# Patient Record
Sex: Female | Born: 1935 | Race: White | Hispanic: No | Smoking: Never smoker
Health system: Southern US, Community
[De-identification: ages and names within clinical notes are randomized; demographics above are authoritative.]

## PROBLEM LIST (undated history)

## (undated) DIAGNOSIS — G20 Parkinson's disease: Secondary | ICD-10-CM

## (undated) DIAGNOSIS — M1990 Unspecified osteoarthritis, unspecified site: Secondary | ICD-10-CM

## (undated) DIAGNOSIS — G20A1 Parkinson's disease without dyskinesia, without mention of fluctuations: Secondary | ICD-10-CM

## (undated) DIAGNOSIS — I10 Essential (primary) hypertension: Secondary | ICD-10-CM

## (undated) HISTORY — PX: ABDOMINAL HYSTERECTOMY: SHX81

## (undated) HISTORY — PX: BREAST SURGERY: SHX581

## (undated) HISTORY — PX: FRACTURE SURGERY: SHX138

## (undated) HISTORY — PX: APPENDECTOMY: SHX54

---

## 1997-07-17 ENCOUNTER — Encounter: Admission: RE | Admit: 1997-07-17 | Discharge: 1997-10-15 | Payer: Self-pay | Admitting: Radiation Oncology

## 2001-05-02 ENCOUNTER — Encounter (HOSPITAL_COMMUNITY): Admission: RE | Admit: 2001-05-02 | Discharge: 2001-06-01 | Payer: Self-pay | Admitting: Oncology

## 2001-05-02 ENCOUNTER — Encounter: Admission: RE | Admit: 2001-05-02 | Discharge: 2001-05-02 | Payer: Self-pay | Admitting: Oncology

## 2001-05-29 ENCOUNTER — Ambulatory Visit (HOSPITAL_COMMUNITY): Admission: RE | Admit: 2001-05-29 | Discharge: 2001-05-29 | Payer: Self-pay | Admitting: Pulmonary Disease

## 2002-05-01 ENCOUNTER — Encounter: Admission: RE | Admit: 2002-05-01 | Discharge: 2002-05-01 | Payer: Self-pay | Admitting: Oncology

## 2002-05-01 ENCOUNTER — Encounter (HOSPITAL_COMMUNITY): Admission: RE | Admit: 2002-05-01 | Discharge: 2002-05-31 | Payer: Self-pay | Admitting: Oncology

## 2002-06-07 ENCOUNTER — Encounter (HOSPITAL_COMMUNITY): Admission: RE | Admit: 2002-06-07 | Discharge: 2002-07-07 | Payer: Self-pay | Admitting: Oncology

## 2002-06-07 ENCOUNTER — Encounter (HOSPITAL_COMMUNITY): Payer: Self-pay | Admitting: Oncology

## 2002-06-07 ENCOUNTER — Encounter: Admission: RE | Admit: 2002-06-07 | Discharge: 2002-06-07 | Payer: Self-pay | Admitting: Oncology

## 2003-03-29 ENCOUNTER — Emergency Department (HOSPITAL_COMMUNITY): Admission: EM | Admit: 2003-03-29 | Discharge: 2003-03-30 | Payer: Self-pay | Admitting: *Deleted

## 2003-06-03 ENCOUNTER — Observation Stay (HOSPITAL_COMMUNITY): Admission: EM | Admit: 2003-06-03 | Discharge: 2003-06-04 | Payer: Self-pay | Admitting: Emergency Medicine

## 2003-07-09 ENCOUNTER — Ambulatory Visit (HOSPITAL_COMMUNITY): Admission: RE | Admit: 2003-07-09 | Discharge: 2003-07-09 | Payer: Self-pay | Admitting: Pulmonary Disease

## 2003-07-15 ENCOUNTER — Encounter: Admission: RE | Admit: 2003-07-15 | Discharge: 2003-07-15 | Payer: Self-pay | Admitting: Oncology

## 2003-07-15 ENCOUNTER — Encounter (HOSPITAL_COMMUNITY): Admission: RE | Admit: 2003-07-15 | Discharge: 2003-08-14 | Payer: Self-pay | Admitting: Oncology

## 2004-07-12 ENCOUNTER — Encounter (HOSPITAL_COMMUNITY): Admission: RE | Admit: 2004-07-12 | Discharge: 2004-08-11 | Payer: Self-pay | Admitting: Oncology

## 2004-07-12 ENCOUNTER — Ambulatory Visit (HOSPITAL_COMMUNITY): Payer: Self-pay | Admitting: Oncology

## 2004-07-12 ENCOUNTER — Encounter: Admission: RE | Admit: 2004-07-12 | Discharge: 2004-07-12 | Payer: Self-pay | Admitting: Oncology

## 2004-07-21 ENCOUNTER — Ambulatory Visit (HOSPITAL_COMMUNITY): Admission: RE | Admit: 2004-07-21 | Discharge: 2004-07-21 | Payer: Self-pay | Admitting: Pulmonary Disease

## 2005-07-12 ENCOUNTER — Ambulatory Visit (HOSPITAL_COMMUNITY): Payer: Self-pay | Admitting: Oncology

## 2005-07-12 ENCOUNTER — Encounter: Admission: RE | Admit: 2005-07-12 | Discharge: 2005-07-12 | Payer: Self-pay | Admitting: Oncology

## 2005-07-12 ENCOUNTER — Encounter (HOSPITAL_COMMUNITY): Admission: RE | Admit: 2005-07-12 | Discharge: 2005-08-11 | Payer: Self-pay | Admitting: Oncology

## 2005-07-27 ENCOUNTER — Ambulatory Visit (HOSPITAL_COMMUNITY): Admission: RE | Admit: 2005-07-27 | Discharge: 2005-07-27 | Payer: Self-pay | Admitting: Pulmonary Disease

## 2005-08-23 ENCOUNTER — Ambulatory Visit (HOSPITAL_COMMUNITY): Admission: RE | Admit: 2005-08-23 | Discharge: 2005-08-23 | Payer: Self-pay | Admitting: Pulmonary Disease

## 2006-07-12 ENCOUNTER — Ambulatory Visit (HOSPITAL_COMMUNITY): Payer: Self-pay | Admitting: Oncology

## 2006-07-31 ENCOUNTER — Ambulatory Visit (HOSPITAL_COMMUNITY): Admission: RE | Admit: 2006-07-31 | Discharge: 2006-07-31 | Payer: Self-pay | Admitting: Pulmonary Disease

## 2007-07-11 ENCOUNTER — Ambulatory Visit (HOSPITAL_COMMUNITY): Payer: Self-pay | Admitting: Oncology

## 2007-08-02 ENCOUNTER — Ambulatory Visit (HOSPITAL_COMMUNITY): Admission: RE | Admit: 2007-08-02 | Discharge: 2007-08-02 | Payer: Self-pay | Admitting: Pulmonary Disease

## 2008-08-04 ENCOUNTER — Ambulatory Visit (HOSPITAL_COMMUNITY): Admission: RE | Admit: 2008-08-04 | Discharge: 2008-08-04 | Payer: Self-pay | Admitting: Pulmonary Disease

## 2009-09-01 ENCOUNTER — Ambulatory Visit (HOSPITAL_COMMUNITY): Admission: RE | Admit: 2009-09-01 | Discharge: 2009-09-01 | Payer: Self-pay | Admitting: Pulmonary Disease
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
## 2010-09-03 NOTE — Op Note (Signed)
[name], [name]                 ACCOUNT NO.:  [id number]   MEDICAL RECORD NO.:  [id number]          PATIENT TYPE:  OUT   LOCATION:  RAD                           FACILITY:  [facility]   PHYSICIAN:  Edgardo Roys, MS,CC-SLPDATE OF BIRTH:  01-02-1917   DATE OF PROCEDURE:  08/23/2005  DATE OF DISCHARGE:                                 OPERATIVE REPORT   PROCEDURE:  Modified barium swallow study.   REFERRING PHYSICIAN:  Edward L. Juanetta Gosling, M.D.   The patient is an 77 year old female who lives at home by herself.  Medical  history is specific for:  HTN and breast cancer (approximately eight years  ago).  Patient referred for a modified barium swallow study due to reporting  feeling a tingling sensation in throat and having to cough as a result  when she eats solid foods at times.  The patient also reports having  difficulty with large pills at times.   The patient is able to follow directions to complete swallowing evaluation.   Dentition:  Dentures, both top and bottom, per patient report.   Secretions:  Normal.   Postural control:  Adequate for testing.   Current diet:  Mechanical soft and liquids thin.   This videofluoroscopic swallow evaluation was conducted in collaboration  with radiologist Dr. Tyron Russell.  The patient was presented with several  consistencies, which included thin liquids, nectar liquids, honey liquids,  puree, cracker, and pill.  The patient presents with mild oropharyngeal  dysphagia characterized by premature loss of bolus over the base of tongue  to the pyriform sinus with thin liquids (mild), nectar liquids with a straw,  and honey liquids with a straw.  Patient had mild delayed swallowing  initiation with all consistencies.  The patient had mild residue to the  vallecula with thin liquids, nectar liquids with and without a straw, as  well as mild residue to the pyriform sinus with thin liquids, nectar liquids  with a straw.  Patient also had insignificant to  mild vallecular residue  with puree and cracker consistencies.  When patient was given liquids after  solid consumption, residue was decreased to insignificant levels.  The  patient tolerated the pill within functional limits.  Patient had flash  penetration with thin and honey liquids x1 and penetration to the core level  with nectar liquids with a straw.  The patient was able to clear nectar  penetrates with a cough.  A chin tuck strategy was attempted with thin  liquids and flash penetration continued to occur.   Recommendations are as follows:   Diet:  Mechanical soft (d/t pt comfort).  Liquids:  All.  No swallowing  therapy recommended at this time.   Aspiration Precautions are as follows:  Consider crushing larger  pills/medications or cutting them in half.  Eat and drink only when alert.  Positioning approximately 90 degrees upright.  No straws.  SMALL BITES OR  SIPS.  Follow solids with liquids.  Oral care.      Edgardo Roys, MS,CC-SLP  Electronically Signed     LG/MEDQ  D:  08/23/2005  T:  08/24/2005  Job:  [id number]   cc:   Ramon Dredge L. Juanetta Gosling, M.D.  Fax: [phone]
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
## 2011-01-31 ENCOUNTER — Other Ambulatory Visit: Payer: Self-pay | Admitting: Neurology

## 2011-01-31 DIAGNOSIS — R200 Anesthesia of skin: Secondary | ICD-10-CM

## 2011-02-03 ENCOUNTER — Other Ambulatory Visit (HOSPITAL_COMMUNITY): Payer: Self-pay

## 2011-02-07 ENCOUNTER — Ambulatory Visit (HOSPITAL_COMMUNITY)
Admission: RE | Admit: 2011-02-07 | Discharge: 2011-02-07 | Disposition: A | Discharge status: Home / Self Care | Payer: Medicare Other | Source: Ambulatory Visit | Attending: Neurology | Admitting: Neurology

## 2011-02-07 ENCOUNTER — Other Ambulatory Visit (HOSPITAL_COMMUNITY): Payer: Self-pay

## 2011-02-07 DIAGNOSIS — I10 Essential (primary) hypertension: Secondary | ICD-10-CM | POA: Insufficient documentation

## 2011-02-07 DIAGNOSIS — R209 Unspecified disturbances of skin sensation: Secondary | ICD-10-CM | POA: Insufficient documentation

## 2011-02-07 DIAGNOSIS — R200 Anesthesia of skin: Secondary | ICD-10-CM

## 2011-02-07 DIAGNOSIS — I6529 Occlusion and stenosis of unspecified carotid artery: Secondary | ICD-10-CM | POA: Insufficient documentation

## 2011-04-09 ENCOUNTER — Emergency Department (HOSPITAL_COMMUNITY): Payer: Medicare Other

## 2011-04-09 ENCOUNTER — Inpatient Hospital Stay (HOSPITAL_COMMUNITY)
Admission: EM | Admit: 2011-04-09 | Discharge: 2011-04-14 | DRG: 481 | Disposition: A | Discharge status: Skilled Nursing Facility | Payer: Medicare Other | Attending: Internal Medicine | Admitting: Internal Medicine

## 2011-04-09 ENCOUNTER — Other Ambulatory Visit: Payer: Self-pay

## 2011-04-09 DIAGNOSIS — S72009A Fracture of unspecified part of neck of unspecified femur, initial encounter for closed fracture: Secondary | ICD-10-CM | POA: Diagnosis present

## 2011-04-09 DIAGNOSIS — I959 Hypotension, unspecified: Secondary | ICD-10-CM | POA: Diagnosis present

## 2011-04-09 DIAGNOSIS — S72001A Fracture of unspecified part of neck of right femur, initial encounter for closed fracture: Secondary | ICD-10-CM

## 2011-04-09 DIAGNOSIS — D62 Acute posthemorrhagic anemia: Secondary | ICD-10-CM | POA: Diagnosis not present

## 2011-04-09 DIAGNOSIS — G20 Parkinson's disease: Secondary | ICD-10-CM | POA: Diagnosis present

## 2011-04-09 DIAGNOSIS — Z79899 Other long term (current) drug therapy: Secondary | ICD-10-CM

## 2011-04-09 DIAGNOSIS — W19XXXA Unspecified fall, initial encounter: Secondary | ICD-10-CM | POA: Diagnosis present

## 2011-04-09 DIAGNOSIS — M129 Arthropathy, unspecified: Secondary | ICD-10-CM | POA: Diagnosis present

## 2011-04-09 DIAGNOSIS — S72143A Displaced intertrochanteric fracture of unspecified femur, initial encounter for closed fracture: Principal | ICD-10-CM | POA: Diagnosis present

## 2011-04-09 DIAGNOSIS — Z7982 Long term (current) use of aspirin: Secondary | ICD-10-CM

## 2011-04-09 DIAGNOSIS — I10 Essential (primary) hypertension: Secondary | ICD-10-CM | POA: Diagnosis present

## 2011-04-09 DIAGNOSIS — Z9849 Cataract extraction status, unspecified eye: Secondary | ICD-10-CM

## 2011-04-09 DIAGNOSIS — D696 Thrombocytopenia, unspecified: Secondary | ICD-10-CM | POA: Diagnosis present

## 2011-04-09 DIAGNOSIS — G20A1 Parkinson's disease without dyskinesia, without mention of fluctuations: Secondary | ICD-10-CM | POA: Diagnosis present

## 2011-04-09 HISTORY — DX: Parkinson's disease without dyskinesia, without mention of fluctuations: G20.A1

## 2011-04-09 HISTORY — DX: Parkinson's disease: G20

## 2011-04-09 HISTORY — DX: Essential (primary) hypertension: I10

## 2011-04-09 HISTORY — DX: Unspecified osteoarthritis, unspecified site: M19.90

## 2011-04-09 LAB — CBC
HCT: 42.2 % (ref 36.0–46.0)
MCH: 30.8 pg (ref 26.0–34.0)
MCHC: 32.7 g/dL (ref 30.0–36.0)
MCV: 94.2 fL (ref 78.0–100.0)
Platelets: 213 10*3/uL (ref 150–400)
RDW: 14.3 % (ref 11.5–15.5)

## 2011-04-09 LAB — DIFFERENTIAL
Basophils Absolute: 0 10*3/uL (ref 0.0–0.1)
Eosinophils Absolute: 0.2 10*3/uL (ref 0.0–0.7)
Eosinophils Relative: 2 % (ref 0–5)
Lymphocytes Relative: 20 % (ref 12–46)
Monocytes Absolute: 0.9 10*3/uL (ref 0.1–1.0)

## 2011-04-09 LAB — URINALYSIS, ROUTINE W REFLEX MICROSCOPIC
Glucose, UA: NEGATIVE mg/dL
Hgb urine dipstick: NEGATIVE
Ketones, ur: NEGATIVE mg/dL
Protein, ur: NEGATIVE mg/dL
Urobilinogen, UA: 0.2 mg/dL (ref 0.0–1.0)

## 2011-04-09 LAB — COMPREHENSIVE METABOLIC PANEL
AST: 25 U/L (ref 0–37)
CO2: 29 mEq/L (ref 19–32)
Calcium: 10.2 mg/dL (ref 8.4–10.5)
Creatinine, Ser: 0.74 mg/dL (ref 0.50–1.10)
GFR calc Af Amer: 83 mL/min — ABNORMAL LOW (ref >90)
GFR calc non Af Amer: 72 mL/min — ABNORMAL LOW (ref >90)
Sodium: 137 mEq/L (ref 135–145)
Total Protein: 7.1 g/dL (ref 6.0–8.3)

## 2011-04-09 LAB — PROTIME-INR
INR: 0.92 (ref 0.00–1.49)
Prothrombin Time: 12.6 seconds (ref 11.6–15.2)

## 2011-04-09 LAB — SAMPLE TO BLOOD BANK

## 2011-04-09 LAB — URINE MICROSCOPIC-ADD ON

## 2011-04-09 LAB — TROPONIN I: Troponin I: <0.3 ng/mL (ref <0.30)

## 2011-04-09 MED ORDER — MORPHINE SULFATE 4 MG/ML IJ SOLN
4.0000 mg | Freq: Once | INTRAMUSCULAR | Status: AC
  Administered 2011-04-09: 4 mg via INTRAVENOUS
  Filled 2011-04-09: qty 1

## 2011-04-09 MED ORDER — ACETAMINOPHEN 650 MG RE SUPP: 650.0000 mg | Freq: Four times a day (QID) | RECTAL | Status: DC | PRN

## 2011-04-09 MED ORDER — SODIUM CHLORIDE 0.9 % IV SOLN
Freq: Once | INTRAVENOUS | Status: AC
  Administered 2011-04-09: 16:00:00 via INTRAVENOUS

## 2011-04-09 MED ORDER — ACETAMINOPHEN 325 MG PO TABS
650.0000 mg | ORAL_TABLET | Freq: Four times a day (QID) | ORAL | Status: DC | PRN
  Administered 2011-04-11 – 2011-04-14 (×7): 650 mg via ORAL
  Filled 2011-04-09 (×7): qty 2

## 2011-04-09 MED ORDER — ONDANSETRON HCL 4 MG/2ML IJ SOLN: 4.0000 mg | Freq: Four times a day (QID) | INTRAMUSCULAR | Status: DC | PRN

## 2011-04-09 MED ORDER — ALUM & MAG HYDROXIDE-SIMETH 200-200-20 MG/5ML PO SUSP: 30.0000 mL | Freq: Four times a day (QID) | ORAL | Status: DC | PRN

## 2011-04-09 MED ORDER — ONDANSETRON HCL 4 MG/2ML IJ SOLN
4.0000 mg | Freq: Once | INTRAMUSCULAR | Status: AC
  Administered 2011-04-09: 4 mg via INTRAVENOUS
  Filled 2011-04-09: qty 2

## 2011-04-09 MED ORDER — ONDANSETRON HCL 4 MG PO TABS: 4.0000 mg | ORAL_TABLET | Freq: Four times a day (QID) | ORAL | Status: DC | PRN

## 2011-04-09 MED ORDER — OXYCODONE HCL 5 MG PO TABS
5.0000 mg | ORAL_TABLET | ORAL | Status: DC | PRN
  Administered 2011-04-10: 5 mg via ORAL
  Filled 2011-04-09: qty 1

## 2011-04-09 MED ORDER — HYDROMORPHONE HCL PF 1 MG/ML IJ SOLN
0.5000 mg | INTRAMUSCULAR | Status: DC | PRN
  Administered 2011-04-11: 1 mg via INTRAVENOUS
  Filled 2011-04-09: qty 1

## 2011-04-09 MED ORDER — SODIUM CHLORIDE 0.9 % IV SOLN
INTRAVENOUS | Status: DC
  Administered 2011-04-10: 05:00:00 via INTRAVENOUS

## 2011-04-09 MED ORDER — MORPHINE SULFATE 4 MG/ML IJ SOLN
4.0000 mg | INTRAMUSCULAR | Status: DC | PRN
  Administered 2011-04-09: 4 mg via INTRAVENOUS
  Filled 2011-04-09: qty 1

## 2011-04-09 MED ORDER — ONDANSETRON HCL 4 MG/2ML IJ SOLN: 4.0000 mg | INTRAMUSCULAR | Status: DC | PRN

## 2011-04-09 NOTE — ED Notes (Signed)
Pt back from xray dept 

## 2011-04-09 NOTE — ED Notes (Signed)
Pt tolerated p.o. Intake with no difficulty.  Comfort measures in place.  Pt denies needs at this time.  Still waiting for [facility] to transport to [facility].
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
## 2011-04-09 NOTE — Progress Notes (Signed)
Pt admitted to 3705-02. VSS, pt alert and oriented X 4, pt son at bedside, flow manager paged and notified of pt's admission.  Westley Chandler, Charity fundraiser.

## 2011-04-09 NOTE — ED Notes (Signed)
Report given to [facility] 

## 2011-04-09 NOTE — ED Notes (Signed)
Pt left department via [facility].
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
## 2011-04-09 NOTE — ED Notes (Signed)
Report called to Noreene Larsson, Charity fundraiser.  Pt to be admitted to [facility].  Room [phone],

## 2011-04-10 ENCOUNTER — Encounter (HOSPITAL_COMMUNITY): Payer: Self-pay | Admitting: Anesthesiology

## 2011-04-10 ENCOUNTER — Encounter (HOSPITAL_COMMUNITY)
Admission: EM | Disposition: A | Discharge status: Skilled Nursing Facility | Payer: Self-pay | Source: Home / Self Care | Attending: Internal Medicine

## 2011-04-10 ENCOUNTER — Inpatient Hospital Stay (HOSPITAL_COMMUNITY): Payer: Medicare Other

## 2011-04-10 ENCOUNTER — Inpatient Hospital Stay (HOSPITAL_COMMUNITY): Payer: Medicare Other | Admitting: Anesthesiology

## 2011-04-10 ENCOUNTER — Other Ambulatory Visit: Payer: Self-pay

## 2011-04-10 HISTORY — PX: COMPRESSION HIP SCREW: SHX1386

## 2011-04-10 LAB — CARDIAC PANEL(CRET KIN+CKTOT+MB+TROPI)
CK, MB: 4.6 ng/mL — ABNORMAL HIGH (ref 0.3–4.0)
Relative Index: 4.5 — ABNORMAL HIGH (ref 0.0–2.5)
Total CK: 102 U/L (ref 7–177)

## 2011-04-10 LAB — CBC
MCH: 30.3 pg (ref 26.0–34.0)
MCHC: 31.9 g/dL (ref 30.0–36.0)
MCV: 94.7 fL (ref 78.0–100.0)
Platelets: 174 10*3/uL (ref 150–400)
RDW: 14.5 % (ref 11.5–15.5)

## 2011-04-10 LAB — BASIC METABOLIC PANEL
CO2: 32 mEq/L (ref 19–32)
Calcium: 9.1 mg/dL (ref 8.4–10.5)
Creatinine, Ser: 0.76 mg/dL (ref 0.50–1.10)
GFR calc non Af Amer: 71 mL/min — ABNORMAL LOW (ref >90)
Glucose, Bld: 101 mg/dL — ABNORMAL HIGH (ref 70–99)

## 2011-04-10 LAB — URINE CULTURE
Colony Count: NO GROWTH
Culture: NO GROWTH

## 2011-04-10 SURGERY — COMPRESSION HIP
Anesthesia: Choice | Site: Hip | Laterality: Right | Wound class: Clean

## 2011-04-10 MED ORDER — CEFAZOLIN SODIUM 1-5 GM-% IV SOLN
1.0000 g | Freq: Four times a day (QID) | INTRAVENOUS | Status: AC
  Administered 2011-04-10 – 2011-04-11 (×3): 1 g via INTRAVENOUS
  Filled 2011-04-10 (×3): qty 50

## 2011-04-10 MED ORDER — HYDROCHLOROTHIAZIDE 12.5 MG PO CAPS
12.5000 mg | ORAL_CAPSULE | Freq: Every day | ORAL | Status: DC
  Filled 2011-04-10: qty 1

## 2011-04-10 MED ORDER — METOCLOPRAMIDE HCL 5 MG/ML IJ SOLN
5.0000 mg | Freq: Three times a day (TID) | INTRAMUSCULAR | Status: DC | PRN
  Filled 2011-04-10: qty 2

## 2011-04-10 MED ORDER — FENTANYL CITRATE 0.05 MG/ML IJ SOLN: 25.0000 ug | INTRAMUSCULAR | Status: DC | PRN

## 2011-04-10 MED ORDER — SUCCINYLCHOLINE CHLORIDE 20 MG/ML IJ SOLN
INTRAMUSCULAR | Status: DC | PRN
  Administered 2011-04-10: 80 mg via INTRAVENOUS

## 2011-04-10 MED ORDER — ENOXAPARIN SODIUM 30 MG/0.3ML ~~LOC~~ SOLN
30.0000 mg | Freq: Two times a day (BID) | SUBCUTANEOUS | Status: DC
  Administered 2011-04-11 – 2011-04-14 (×7): 30 mg via SUBCUTANEOUS
  Filled 2011-04-10 (×9): qty 0.3

## 2011-04-10 MED ORDER — LACTATED RINGERS IV SOLN
INTRAVENOUS | Status: DC | PRN
  Administered 2011-04-10 (×2): via INTRAVENOUS

## 2011-04-10 MED ORDER — HYDROCHLOROTHIAZIDE 25 MG PO TABS
12.5000 mg | ORAL_TABLET | Freq: Every day | ORAL | Status: DC
  Filled 2011-04-10: qty 0.5

## 2011-04-10 MED ORDER — EPHEDRINE SULFATE 50 MG/ML IJ SOLN
INTRAMUSCULAR | Status: DC | PRN
  Administered 2011-04-10: 10 mg via INTRAVENOUS

## 2011-04-10 MED ORDER — CEFAZOLIN SODIUM 1-5 GM-% IV SOLN
INTRAVENOUS | Status: DC | PRN
  Administered 2011-04-10: 1 g via INTRAVENOUS

## 2011-04-10 MED ORDER — LACTATED RINGERS IV SOLN: INTRAVENOUS | Status: DC

## 2011-04-10 MED ORDER — PROPOFOL 10 MG/ML IV EMUL
INTRAVENOUS | Status: DC | PRN
  Administered 2011-04-10: 60 mg via INTRAVENOUS

## 2011-04-10 MED ORDER — SODIUM CHLORIDE 0.9 % IV SOLN: INTRAVENOUS | Status: DC

## 2011-04-10 MED ORDER — LORATADINE 10 MG PO TABS
10.0000 mg | ORAL_TABLET | Freq: Every day | ORAL | Status: DC
  Administered 2011-04-11 – 2011-04-14 (×4): 10 mg via ORAL
  Filled 2011-04-10 (×5): qty 1

## 2011-04-10 MED ORDER — CEFAZOLIN SODIUM 1-5 GM-% IV SOLN
INTRAVENOUS | Status: AC
  Filled 2011-04-10: qty 50

## 2011-04-10 MED ORDER — PHENYLEPHRINE HCL 10 MG/ML IJ SOLN
INTRAMUSCULAR | Status: DC | PRN
  Administered 2011-04-10: 80 ug via INTRAVENOUS
  Administered 2011-04-10 (×2): 120 ug via INTRAVENOUS

## 2011-04-10 MED ORDER — ONDANSETRON HCL 4 MG PO TABS: 4.0000 mg | ORAL_TABLET | Freq: Four times a day (QID) | ORAL | Status: DC | PRN

## 2011-04-10 MED ORDER — ONDANSETRON HCL 4 MG/2ML IJ SOLN: 4.0000 mg | Freq: Four times a day (QID) | INTRAMUSCULAR | Status: DC | PRN

## 2011-04-10 MED ORDER — CHOLECALCIFEROL 10 MCG (400 UNIT) PO TABS
400.0000 [IU] | ORAL_TABLET | Freq: Every day | ORAL | Status: DC
  Administered 2011-04-11 – 2011-04-14 (×4): 400 [IU] via ORAL
  Filled 2011-04-10 (×5): qty 1

## 2011-04-10 MED ORDER — PROMETHAZINE HCL 25 MG/ML IJ SOLN: 6.2500 mg | INTRAMUSCULAR | Status: DC | PRN

## 2011-04-10 MED ORDER — GLYCOPYRROLATE 0.2 MG/ML IJ SOLN
INTRAMUSCULAR | Status: DC | PRN
  Administered 2011-04-10: .2 mg via INTRAVENOUS

## 2011-04-10 MED ORDER — ONDANSETRON HCL 4 MG/2ML IJ SOLN
INTRAMUSCULAR | Status: DC | PRN
  Administered 2011-04-10: 4 mg via INTRAVENOUS

## 2011-04-10 MED ORDER — CARBIDOPA-LEVODOPA 25-100 MG PO TABS
1.0000 | ORAL_TABLET | Freq: Four times a day (QID) | ORAL | Status: DC
  Administered 2011-04-10 – 2011-04-14 (×14): 1 via ORAL
  Filled 2011-04-10 (×19): qty 1

## 2011-04-10 MED ORDER — FENTANYL CITRATE 0.05 MG/ML IJ SOLN
INTRAMUSCULAR | Status: DC | PRN
  Administered 2011-04-10: 100 ug via INTRAVENOUS
  Administered 2011-04-10: 25 ug via INTRAVENOUS

## 2011-04-10 MED ORDER — METOCLOPRAMIDE HCL 5 MG PO TABS
5.0000 mg | ORAL_TABLET | Freq: Three times a day (TID) | ORAL | Status: DC | PRN
Start: 2011-04-10 — End: 2011-04-12
  Filled 2011-04-10: qty 2

## 2011-04-10 MED ORDER — 0.9 % SODIUM CHLORIDE (POUR BTL) OPTIME
TOPICAL | Status: DC | PRN
  Administered 2011-04-10: 1000 mL

## 2011-04-10 SURGICAL SUPPLY — 43 items
BANDAGE GAUZE ELAST BULKY 4 IN (GAUZE/BANDAGES/DRESSINGS) ×2 IMPLANT
BNDG COHESIVE 4X5 TAN STRL (GAUZE/BANDAGES/DRESSINGS) ×2 IMPLANT
CLOTH BEACON ORANGE TIMEOUT ST (SAFETY) ×2 IMPLANT
DRAPE STERI IOBAN 125X83 (DRAPES) ×2 IMPLANT
DRSG ADAPTIC 3X8 NADH LF (GAUZE/BANDAGES/DRESSINGS) ×2 IMPLANT
DRSG MEPILEX BORDER 4X4 (GAUZE/BANDAGES/DRESSINGS) ×2 IMPLANT
DRSG MEPILEX BORDER 4X8 (GAUZE/BANDAGES/DRESSINGS) ×2 IMPLANT
DRSG PAD ABDOMINAL 8X10 ST (GAUZE/BANDAGES/DRESSINGS) ×2 IMPLANT
DURAPREP 26ML APPLICATOR (WOUND CARE) ×2 IMPLANT
ELECT CAUTERY BLADE 6.4 (BLADE) ×2 IMPLANT
ELECT REM PT RETURN 9FT ADLT (ELECTROSURGICAL) ×2
ELECTRODE REM PT RTRN 9FT ADLT (ELECTROSURGICAL) ×1 IMPLANT
EVACUATOR 1/8 PVC DRAIN (DRAIN) IMPLANT
GLOVE BIO SURGEON STRL SZ8.5 (GLOVE) ×2 IMPLANT
GLOVE BIOGEL PI IND STRL 8 (GLOVE) ×1 IMPLANT
GLOVE BIOGEL PI IND STRL 8.5 (GLOVE) ×1 IMPLANT
GLOVE BIOGEL PI INDICATOR 8 (GLOVE) ×1
GLOVE BIOGEL PI INDICATOR 8.5 (GLOVE) ×1
GLOVE SS BIOGEL STRL SZ 8 (GLOVE) ×1 IMPLANT
GLOVE SUPERSENSE BIOGEL SZ 8 (GLOVE) ×1
GOWN PREVENTION PLUS XLARGE (GOWN DISPOSABLE) ×6 IMPLANT
GOWN STRL NON-REIN LRG LVL3 (GOWN DISPOSABLE) ×4 IMPLANT
GOWN STRL REIN 2XL LVL4 (GOWN DISPOSABLE) ×2 IMPLANT
GUIDEPIN 3.2X17.5 THRD DISP (PIN) ×1 IMPLANT
GUIDEWIRE BALL NOSE 100CM (WIRE) ×1 IMPLANT
HFN LAG SCREW 10.5MM X 85MM (Screw) ×1 IMPLANT
KIT BASIN OR (CUSTOM PROCEDURE TRAY) ×2 IMPLANT
KIT ROOM TURNOVER OR (KITS) ×2 IMPLANT
MANIFOLD NEPTUNE II (INSTRUMENTS) ×2 IMPLANT
NAIL AFFIXUS RT 11X340MM (Nail) ×1 IMPLANT
NS IRRIG 1000ML POUR BTL (IV SOLUTION) ×2 IMPLANT
PACK GENERAL/GYN (CUSTOM PROCEDURE TRAY) ×2 IMPLANT
PAD ARMBOARD 7.5X6 YLW CONV (MISCELLANEOUS) ×4 IMPLANT
SCREW BONE CORTICAL 5.0X36 (Screw) IMPLANT
SCREW BONE CORTICAL 5.0X38 (Screw) ×1 IMPLANT
STAPLER VISISTAT 35W (STAPLE) ×2 IMPLANT
SUT VIC AB 0 CT1 27 (SUTURE) ×4
SUT VIC AB 0 CT1 27XBRD ANBCTR (SUTURE) ×2 IMPLANT
SUT VIC AB 2-0 CT1 27 (SUTURE) ×4
SUT VIC AB 2-0 CT1 TAPERPNT 27 (SUTURE) ×2 IMPLANT
TOWEL OR 17X24 6PK STRL BLUE (TOWEL DISPOSABLE) ×2 IMPLANT
TOWEL OR 17X26 10 PK STRL BLUE (TOWEL DISPOSABLE) ×2 IMPLANT
WATER STERILE IRR 1000ML POUR (IV SOLUTION) ×2 IMPLANT

## 2011-04-10 NOTE — Transfer of Care (Signed)
Immediate Anesthesia Transfer of Care Note  Patient: [name]  Procedure(s) Performed:  COMPRESSION HIP  Patient Location: PACU  Anesthesia Type: General  Level of Consciousness: awake, alert  and sedated  Airway & Oxygen Therapy: Patient Spontanous Breathing and Patient connected to nasal cannula oxygen  Post-op Assessment: Report given to PACU RN, Patient moving all extremities and Patient moving all extremities X 4  Post vital signs: Reviewed and stable  Complications: No apparent anesthesia complications

## 2011-04-10 NOTE — Progress Notes (Signed)
Pt returned from surgery, lethargic but arousable.  Attempted to give pt a sip of water.  Pt with coughing following swallow.  Will continue to keep pt NPO until more awake post surgery.  [name], [facility]

## 2011-04-10 NOTE — Brief Op Note (Signed)
GLENDIA OLSHEFSKI [id number] 04/10/2011   PRE-OP DIAGNOSIS:right  IT hip fracture  POST-OP DIAGNOSIS: same  PROCEDURE: right hip trochanteric nail  ANESTHESIA: general  Atiyah Bauer G   Dictation #:  [id number]
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
## 2011-04-10 NOTE — Anesthesia Preprocedure Evaluation (Addendum)
Anesthesia Evaluation  Patient identified by MRN, date of birth, ID band Patient awake    Reviewed: Allergy & Precautions, H&P , NPO status , Patient's Chart, lab work & pertinent test results  Airway Mallampati: I TM Distance: >3 FB Neck ROM: Full    Dental  (+) Edentulous Upper, Edentulous Lower and Dental Advidsory Given   Pulmonary neg pulmonary ROS,  clear to auscultation  Pulmonary exam normal       Cardiovascular hypertension (HCTZ), Pt. on medications Regular Normal    Neuro/Psych Parkinson's diseaseNegative Neurological ROS     GI/Hepatic Neg liver ROS, GERD-  Medicated and Controlled,  Endo/Other  Negative Endocrine ROS  Renal/GU negative Renal ROS     Musculoskeletal   Abdominal   Peds  Hematology   Anesthesia Other Findings   Reproductive/Obstetrics                          Anesthesia Physical Anesthesia Plan  ASA: III  Anesthesia Plan: General   Post-op Pain Management:    Induction: Intravenous  Airway Management Planned: Oral ETT  Additional Equipment:   Intra-op Plan:   Post-operative Plan: Extubation in OR  Informed Consent: I have reviewed the patients History and Physical, chart, labs and discussed the procedure including the risks, benefits and alternatives for the proposed anesthesia with the patient or authorized representative who has indicated his/her understanding and acceptance.     Plan Discussed with: CRNA and Surgeon  Anesthesia Plan Comments: (Plan routine monitors, GETA)        Anesthesia Quick Evaluation

## 2011-04-10 NOTE — Interval H&P Note (Signed)
History and Physical Interval Note:  04/10/2011 1:43 PM  [name]  has presented today for surgery, with the diagnosis of right hip fracture  The various methods of treatment have been discussed with the patient and family. After consideration of risks, benefits and other options for treatment, the patient has consented to  Procedure(s): COMPRESSION HIP as a surgical intervention .  The patients' history has been reviewed, patient examined, no change in status, stable for surgery.  I have reviewed the patients' chart and labs.  Questions were answered to the patient's satisfaction.     Chang Tiggs G

## 2011-04-10 NOTE — Anesthesia Procedure Notes (Addendum)
Anesthesia Regional Block:   Narrative:    Procedure Name: Intubation Date/Time: 04/10/2011 12:17 PM Performed by: Carmela Rima Pre-anesthesia Checklist: Emergency Drugs available, Patient identified, Timeout performed, Suction available and Patient being monitored Patient Re-evaluated:Patient Re-evaluated prior to inductionOxygen Delivery Method: Circle System Utilized Preoxygenation: Pre-oxygenation with 100% oxygen Intubation Type: IV induction and Rapid sequence Ventilation: Mask ventilation without difficulty Laryngoscope Size: Mac and 3 Grade View: Grade I Tube type: Oral Tube size: 7.5 mm Number of attempts: 1 Placement Confirmation: ETT inserted through vocal cords under direct vision,  breath sounds checked- equal and bilateral,  positive ETCO2 and CO2 detector Secured at: 21 cm Tube secured with: Tape Dental Injury: Teeth and Oropharynx as per pre-operative assessment

## 2011-04-10 NOTE — Anesthesia Postprocedure Evaluation (Signed)
  Anesthesia Post-op Note  Patient: [name]  Procedure(s) Performed:  COMPRESSION HIP  Patient Location: PACU  Anesthesia Type: General  Level of Consciousness: awake, alert  and oriented  Airway and Oxygen Therapy: Patient Spontanous Breathing and Patient connected to nasal cannula oxygen  Post-op Pain: none  Post-op Assessment: Post-op Vital signs reviewed, Patient's Cardiovascular Status Stable, Respiratory Function Stable, Patent Airway, No signs of Nausea or vomiting and Pain level controlled  Post-op Vital Signs: Reviewed and stable  Complications: No apparent anesthesia complications

## 2011-04-10 NOTE — Progress Notes (Signed)
Subjective: Sleepy , arousable, just came from surgery.  Objective: Filed Vitals:   04/10/11 1437 04/10/11 1438 04/10/11 1439 04/10/11 1452  BP: 130/53   130/70  Pulse: 85 87 83 90  Temp:    98.2 F (36.8 C)  TempSrc:      Resp: 19 20 15 17   Height:      Weight:      SpO2: 99% 98% 100% 96%   Weight change:   Intake/Output Summary (Last 24 hours) at 04/10/11 1610 Last data filed at 04/10/11 1430  Gross per 24 hour  Intake   1775 ml  Output    550 ml  Net   1225 ml    General: Sedated, in no acute distress.  HEENT: No bruits, no goiter.  Heart: Regular rate and rhythm, without murmurs, rubs, gallops.  Lungs: CTA bilateral air movement.  Abdomen: Soft, nontender, nondistended, positive bowel sounds.  Neuro: sleepy.  Extremities; Right hip with dressing, no evidence of bleed.    Lab Results:  [facility] 04/10/11 0612 04/09/11 1630  NA 142 137  K 3.7 3.5  CL 106 98  CO2 32 29  GLUCOSE 101* 130*  BUN 20 23  CREATININE 0.76 0.74  CALCIUM 9.1 10.2  MG -- --  PHOS -- --    Basename 04/09/11 1630  AST 25  ALT REPEATED TO VERIFY  ALKPHOS 73  BILITOT 0.8  PROT 7.1  ALBUMIN 3.7    Basename 04/10/11 0612 04/09/11 1630  WBC 8.4 12.5*  NEUTROABS -- 8.8*  HGB 11.5* 13.8  HCT 36.0 42.2  MCV 94.7 94.2  PLT 174 213    Basename 04/09/11 2346 04/09/11 1630  CKTOTAL 102 --  CKMB 4.6* --  CKMBINDEX -- --  TROPONINI <0.30 <0.30   Studies/Results: Dg Hip Complete Right  04/09/2011  *RADIOLOGY REPORT*  Clinical Data: Right hip pain post fall  RIGHT HIP - COMPLETE 2+ VIEW  Comparison: None  Findings: Minimally displaced intertrochanteric fracture right femur. Osseous demineralization. Hip and SI joint spaces symmetric and preserved. No dislocation identified. Bony pelvis appears intact. Pelvic phleboliths bilaterally.  IMPRESSION: Minimally displaced intertrochanteric fracture right femur.  Per CMS PQRS reporting requirements (PQRS Measure 24): Given the patient's age of  greater than 50 and the fracture site (hip, distal radius, or spine), the patient should be tested for osteoporosis using DXA, and the appropriate treatment considered based on the DXA results.  Original Report Authenticated By: Lollie Marrow, M.D.   Dg Femur Right  04/10/2011  *RADIOLOGY REPORT*  Clinical Data: Fracture fixation.  RIGHT FEMUR - 2 VIEW  Comparison: Plain films right hip 04/09/2011.  Findings: We are provided with four fluoroscopic spot views of the right femur.  Images demonstrate placement of a dynamic hip screw and long IM nail with a single of the distal interlocking screw for fixation of an intertrochanteric fracture.  IMPRESSION: ORIF right intertrochanteric fracture.  Original Report Authenticated By: Bernadene Bell. D'ALESSIO, M.D.   Dg Femur Right  04/09/2011  *RADIOLOGY REPORT*  Clinical Data: Right hip and femoral pain post fall  RIGHT FEMUR - 2 VIEW  Comparison: Right hip radiographs 04/09/2011  Findings: Intertrochanteric fracture right femur reported separately. Marked bony demineralization. Diffuse joint space narrowing right knee. Chondrocalcinosis right knee question CPPD/pseudogout. No additional fracture, dislocation, or bone destruction.  IMPRESSION: Intertrochanteric fracture right femur reported separately. Osseous demineralization with question CPPD/pseudogout right knee. No additional acute right femoral abnormalities.  Original Report Authenticated By: Lollie Marrow, M.D.  Dg Chest Portable 1 View  04/09/2011  *RADIOLOGY REPORT*  Clinical Data: Fall, preop.  PORTABLE CHEST - 1 VIEW  Comparison: None  Findings: Heart is borderline in size.  Lungs are clear.  No effusions or acute bony abnormality.  IMPRESSION: No active disease.  Original Report Authenticated By: Cyndie Chime, M.D.    Medications: I have reviewed the patient's current medications.  Assessment:  1. Right Hip Fracture  S/P right hip trochanteric nail. Post surgery 12-23. Patient sedated, vitals  stable.  PT per ortho.  2. Mechanical Fall  PT, OT consult per ortho.  3. Parkinsons Disease  Continue with Carbidopa.  4. DVT Prophylaxis; Lovenox.  5-Hypertension: I will hold HCTZ SBP 100 range.      LOS: 1 day   Elisse Pennick M.D.  [facility] 04/10/2011, 4:10 PM

## 2011-04-11 LAB — PREPARE RBC (CROSSMATCH)

## 2011-04-11 LAB — CBC
HCT: 28.7 % — ABNORMAL LOW (ref 36.0–46.0)
Hemoglobin: 9.2 g/dL — ABNORMAL LOW (ref 12.0–15.0)
MCH: 30.3 pg (ref 26.0–34.0)
MCHC: 32.1 g/dL (ref 30.0–36.0)
MCV: 94.4 fL (ref 78.0–100.0)
Platelets: 130 K/uL — ABNORMAL LOW (ref 150–400)
RBC: 3.04 MIL/uL — ABNORMAL LOW (ref 3.87–5.11)
RDW: 14.3 % (ref 11.5–15.5)
WBC: 7 K/uL (ref 4.0–10.5)

## 2011-04-11 LAB — HEMOGLOBIN AND HEMATOCRIT, BLOOD
HCT: 23.3 % — ABNORMAL LOW (ref 36.0–46.0)
Hemoglobin: 7.5 g/dL — ABNORMAL LOW (ref 12.0–15.0)

## 2011-04-11 LAB — BASIC METABOLIC PANEL
Chloride: 104 mEq/L (ref 96–112)
GFR calc Af Amer: 86 mL/min — ABNORMAL LOW (ref >90)
Potassium: 3.4 mEq/L — ABNORMAL LOW (ref 3.5–5.1)

## 2011-04-11 MED ORDER — SODIUM CHLORIDE 0.9 % IV SOLN
INTRAVENOUS | Status: DC
  Administered 2011-04-12 – 2011-04-13 (×3): via INTRAVENOUS

## 2011-04-11 MED ORDER — POTASSIUM CHLORIDE CRYS ER 20 MEQ PO TBCR
40.0000 meq | EXTENDED_RELEASE_TABLET | Freq: Once | ORAL | Status: AC
  Administered 2011-04-11: 40 meq via ORAL
  Filled 2011-04-11: qty 2

## 2011-04-11 MED ORDER — SODIUM CHLORIDE 0.9 % IV SOLN
INTRAVENOUS | Status: DC
  Administered 2011-04-11 (×2): via INTRAVENOUS

## 2011-04-11 NOTE — Progress Notes (Signed)
Met with patient and her son at bedside- they are agreeable to SNF placement and we have secured bed at [facility] [address], for Wednesday if medically stable for move. Also, await Blue Medicare auth for SNF stay. CSW Psychosocial Assessment and FL2 placed in shadow chart in wallaroo. Reece Levy, MSW, Theresia Majors [phone]
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
## 2011-04-11 NOTE — Progress Notes (Signed)
Subjective: Patient awake and oriented to person and place. She is sitting up in the chair. She relates that she's feeling gated and tongue denies any significant right hip pain. She denies chest pain or shortness of breath or weakness. Objective: Filed Vitals:   04/11/11 0545 04/11/11 0800 04/11/11 0940 04/11/11 1400  BP: 102/50 86/52 90/40  97/53  Pulse:  84  81  Temp:  99.7 F (37.6 C)  98.3 F (36.8 C)  TempSrc:  Oral  Oral  Resp:  18  18  Height:      Weight:      SpO2:  95%  98%   Weight change:   Intake/Output Summary (Last 24 hours) at 04/11/11 1456 Last data filed at 04/11/11 1200  Gross per 24 hour  Intake   1460 ml  Output    550 ml  Net    910 ml    General: Alert, awake, oriented x3, in no acute distress.  HEENT: No bruits, no goiter.  Heart: Regular rate and rhythm, without murmurs, rubs, gallops.  Lungs: Clear to auscultation, bilateral air movement.  Abdomen: Soft, nontender, nondistended, positive bowel sounds.  Neuro: Grossly intact, nonfocal. Extremities no edema.  Lab Results:  [facility] 04/11/11 0655 04/10/11 0612  NA 138 142  K 3.4* 3.7  CL 104 106  CO2 28 32  GLUCOSE 93 101*  BUN 16 20  CREATININE 0.67 0.76  CALCIUM 8.7 9.1  MG -- --  PHOS -- --    Basename 04/09/11 1630  AST 25  ALT REPEATED TO VERIFY  ALKPHOS 73  BILITOT 0.8  PROT 7.1  ALBUMIN 3.7    Basename 04/11/11 0655 04/10/11 0612 04/09/11 1630  WBC 7.0 8.4 --  NEUTROABS -- -- 8.8*  HGB 9.2* 11.5* --  HCT 28.7* 36.0 --  MCV 94.4 94.7 --  PLT 130* 174 --    Basename 04/09/11 2346 04/09/11 1630  CKTOTAL 102 --  CKMB 4.6* --  CKMBINDEX -- --  TROPONINI <0.30 <0.30    Micro Results: Recent Results (from the past 240 hour(s))  URINE CULTURE     Status: Normal   Collection Time   04/09/11  5:00 PM      Component Value Range Status Comment   Specimen Description URINE, CATHETERIZED   Final    Special Requests NONE   Final    Setup Time [id number]   Final    Colony Count NO GROWTH   Final    Culture NO GROWTH   Final    Report Status 04/10/2011 FINAL   Final     Studies/Results: Dg Hip Complete Right  04/09/2011  *RADIOLOGY REPORT*  Clinical Data: Right hip pain post fall  RIGHT HIP - COMPLETE 2+ VIEW  Comparison: None  Findings: Minimally displaced intertrochanteric fracture right femur. Osseous demineralization. Hip and SI joint spaces symmetric and preserved. No dislocation identified. Bony pelvis appears intact. Pelvic phleboliths bilaterally.  IMPRESSION: Minimally displaced intertrochanteric fracture right femur.  Per CMS PQRS reporting requirements (PQRS Measure 24): Given the patient's age of greater than 50 and the fracture site (hip, distal radius, or spine), the patient should be tested for osteoporosis using DXA, and the appropriate treatment considered based on the DXA results.  Original Report Authenticated By: Lollie Marrow, M.D.   Dg Femur Right  04/10/2011  *RADIOLOGY REPORT*  Clinical Data: Fracture fixation.  RIGHT FEMUR - 2 VIEW  Comparison: Plain films right hip 04/09/2011.  Findings: We are provided with four fluoroscopic spot views  of the right femur.  Images demonstrate placement of a dynamic hip screw and long IM nail with a single of the distal interlocking screw for fixation of an intertrochanteric fracture.  IMPRESSION: ORIF right intertrochanteric fracture.  Original Report Authenticated By: Bernadene Bell. D'ALESSIO, M.D.   Dg Femur Right  04/09/2011  *RADIOLOGY REPORT*  Clinical Data: Right hip and femoral pain post fall  RIGHT FEMUR - 2 VIEW  Comparison: Right hip radiographs 04/09/2011  Findings: Intertrochanteric fracture right femur reported separately. Marked bony demineralization. Diffuse joint space narrowing right knee. Chondrocalcinosis right knee question CPPD/pseudogout. No additional fracture, dislocation, or bone destruction.  IMPRESSION: Intertrochanteric fracture right femur reported separately. Osseous  demineralization with question CPPD/pseudogout right knee. No additional acute right femoral abnormalities.  Original Report Authenticated By: Lollie Marrow, M.D.   Dg Chest Portable 1 View  04/09/2011  *RADIOLOGY REPORT*  Clinical Data: Fall, preop.  PORTABLE CHEST - 1 VIEW  Comparison: None  Findings: Heart is borderline in size.  Lungs are clear.  No effusions or acute bony abnormality.  IMPRESSION: No active disease.  Original Report Authenticated By: Cyndie Chime, M.D.   Dg C-arm 1-60 Min  04/10/2011  CLINICAL DATA: FEMUR   C-ARM 1-60 MINUTES  Fluoroscopy was utilized by the requesting physician.  No radiographic  interpretation.      Medications: I have reviewed the patient's current medications.  Assessment:  1. Right Hip Fracture  S/P right hip trochanteric nail.  Post surgery 12-23.  PT per ortho.  2. Mechanical Fall  PT, OT. Patient would need short-term rehabilitation.  3. Parkinsons Disease  Continue with Carbidopa.  4. DVT Prophylaxis; Lovenox.  5-Hypertension: I will continue to  hold HCTZ due to hypotension. 6. Hypotension: Asymptomatic. We'll continue with IV fluids. No evidence of infection. We'll avoid opioids. Give Tylenol for pain. I will repeat hemoglobin later this afternoon.  Anemia, post  operative blood loss, expected. Repeat Hb. No transfusion at this time.       LOS: 2 days   Kylii Ennis M.D.  [facility] 04/11/2011, 2:56 PM

## 2011-04-11 NOTE — Progress Notes (Signed)
Patient's hgb/hct 7.5/23.3.  Dr. Sunnie Nielsen paged and notified.  Orders received to transfuse 1 unit prbc's.  Awaiting type and screen.  Patient informed.  Will continue to monitor.  Colman Cater

## 2011-04-11 NOTE — Progress Notes (Signed)
Physical Therapy Evaluation Patient Details Name: [name] MRN: [id number] DOB: 1916/12/02 Today's Date: 04/11/2011  Problem List: There is no problem list on file for this patient.   Past Medical History:  Past Medical History  Diagnosis Date  . Hypertension   . Parkinson disease   . Arthritis    Past Surgical History:  Past Surgical History  Procedure Date  . Breast surgery     PT Assessment/Plan/Recommendation PT Assessment Clinical Impression Statement: pt presents s/p ORIF for R hip fx.  pt very motivated to improve mobility and will need ST-SNF at D/C.   PT Recommendation/Assessment: Patient will need skilled PT in the acute care venue PT Problem List: Decreased strength;Decreased range of motion;Decreased activity tolerance;Decreased balance;Decreased mobility;Decreased knowledge of use of DME;Decreased knowledge of precautions;Pain Barriers to Discharge: None PT Therapy Diagnosis : Abnormality of gait;Acute pain PT Plan PT Frequency: Min 3X/week PT Treatment/Interventions: DME instruction;Gait training;Stair training;Functional mobility training;Therapeutic activities;Therapeutic exercise;Balance training;Patient/family education PT Recommendation Recommendations for Other Services: OT consult Follow Up Recommendations: Skilled nursing facility Equipment Recommended: Defer to next venue PT Goals  Acute Rehab PT Goals PT Goal Formulation: With patient/family Time For Goal Achievement: 2 weeks Pt will go Supine/Side to Sit: with min assist PT Goal: Supine/Side to Sit - Progress: Not met Pt will go Sit to Stand: with min assist PT Goal: Sit to Stand - Progress: Not met Pt will Ambulate: 51 - 150 feet;with min assist;with rolling walker PT Goal: Ambulate - Progress: Not met Pt will Perform Home Exercise Program: Independently PT Goal: Perform Home Exercise Program - Progress: Not met  PT Evaluation Precautions/Restrictions  Precautions Precautions:  Fall Restrictions Weight Bearing Restrictions: Yes RLE Weight Bearing: Partial weight bearing RLE Partial Weight Bearing Percentage or Pounds: 50% Prior Functioning  Home Living Lives With: Alone Receives Help From: Family;Home health (HH 4hrs/day 5 days/wk.  ) Type of Home: House Home Layout: One level Home Access: Stairs to enter Home Adaptive Equipment: Walker - rolling Prior Function Level of Independence: Independent with basic ADLs;Independent with gait;Independent with transfers;Needs assistance with homemaking;Requires assistive device for independence Able to Take Stairs?: Yes Driving: No Cognition Cognition Orientation Level: Oriented X4 Sensation/Coordination   Extremity Assessment RLE Assessment RLE Assessment: Exceptions to [facility] RLE Strength RLE Overall Strength: Due to pain;Deficits LLE Assessment LLE Assessment: Within Functional Limits Mobility (including Balance) Bed Mobility Bed Mobility: Yes Supine to Sit: 1: +2 Total assist;Patient percentage (comment) (pt 30%) Supine to Sit Details (indicate cue type and reason): cues for safe technique and encouragement Sitting - Scoot to Edge of Bed: 2: Max assist Sitting - Scoot to [place] of Bed Details (indicate cue type and reason): cues to attempt reciprocal scoot.   Transfers Transfers: Yes Sit to Stand: 1: +2 Total assist;Patient percentage (comment);From bed Sit to Stand Details (indicate cue type and reason): cues for safe technique, PWBing R LE.   Stand to Sit: 1: +2 Total assist;Patient percentage (comment);To chair/3-in-1 Stand to Sit Details: cues to reach back for armrests and A to control descent.   Ambulation/Gait Ambulation/Gait: No Stairs: No Wheelchair Mobility Wheelchair Mobility: No    Exercise    End of Session PT - End of Session Equipment Utilized During Treatment: Gait belt Activity Tolerance: Patient limited by pain;Patient limited by fatigue Patient left: in chair;with call bell in  reach;with family/visitor present Nurse Communication: Mobility status for transfers General Behavior During Session: [facility] for tasks performed Cognition: [facility] for tasks performed  Sunny Schlein, [place] [phone] 04/11/2011,  11:29 AM

## 2011-04-11 NOTE — Progress Notes (Signed)
   CARE MANAGEMENT NOTE 04/11/2011  Patient:  Gadbois,Glenda G   Account Number:  [id number]  Date Initiated:  04/11/2011  Documentation initiated by:  Donn Pierini  Subjective/Objective Assessment:   Pt admitted with right hip fx.  s/p repair     Action/Plan:   PTA pt lived at home alone, was independent with ADLs PT/OT evals post op   Anticipated DC Date:  04/13/2011   Anticipated DC Plan:  SKILLED NURSING FACILITY  In-house referral  Clinical Social Worker      DC Planning Services  CM consult      Choice offered to / List presented to:             Status of service:  In process, will continue to follow Medicare Important Message given?   (If response is "NO", the following Medicare IM given date fields will be blank) Date Medicare IM given:   Date Additional Medicare IM given:    Discharge Disposition:    Per UR Regulation:  Reviewed for med. necessity/level of care/duration of stay  Comments:  PCP- [place]  04/11/11- 1345- Donn Pierini RN BSN [phone] Discharge planning for SNF, CSW consulted for placement needs

## 2011-04-11 NOTE — Progress Notes (Signed)
Subjective: 1 Day Post-Op Procedure(s) (LRB): COMPRESSION HIP (Right) She is up in bed talking and eating. Also been in chair. Activity level: Has been bed to chair Diet tolerance:eating Voiding with or without catheter:foley in Patient reports pain as 2 on 0-10 scale.    Objective: Vital signs in last 24 hours: Temp:  [97.5 F (36.4 C)-99.7 F (37.6 C)] 99.7 F (37.6 C) (12/24 0800) Pulse Rate:  [70-138] 84  (12/24 0800) Resp:  [9-22] 18  (12/24 0800) BP: (86-132)/(40-82) 90/40 mmHg (12/24 0940) SpO2:  [90 %-100 %] 95 % (12/24 0800) FiO2 (%):  [2 %] 2 % (12/24 0800)  Intake/Output from previous day: 12/23 0701 - 12/24 0700 In: 1835 [P.O.:60; I.V.:1775] Out: 950 [Urine:850; Blood:100] Intake/Output this shift: Total I/O In: 400 [P.O.:400] Out: -    Basename 04/11/11 0655 04/10/11 0612 04/09/11 1630  HGB 9.2* 11.5* 13.8    Basename 04/11/11 0655 04/10/11 0612  WBC 7.0 8.4  RBC 3.04* 3.80*  HCT 28.7* 36.0  PLT 130* 174    Basename 04/11/11 0655 04/10/11 0612  NA 138 142  K 3.4* 3.7  CL 104 106  CO2 28 32  BUN 16 20  CREATININE 0.67 0.76  GLUCOSE 93 101*  CALCIUM 8.7 9.1    Basename 04/09/11 1630  LABPT --  INR 0.92    Neurologically intact ABD soft Neurovascular intact Sensation intact distally Intact pulses distally Dorsiflexion/Plantar flexion intact No cellulitis present Compartment soft Right hip dressings look great. She has good n/v status to both legs, very little swelling. No sign of infection.  Assessment/Plan: 1 Day Post-Op Procedure(s) (LRB): COMPRESSION HIP (Right) Advance diet Up with therapy She can be up with therapy 30 % pwb on right leg. Will change dressing wed and staples stay in for 2 weeks. It sounds like she will need short term SNF  Juanangel Soderholm R 04/11/2011, 1:43 PM

## 2011-04-11 NOTE — Progress Notes (Signed)
Patient with some difficulty swallowing, coughing when drinking and having trouble swallowing pills.  Patient and son report difficulty at home for years.  Speech Therapy seen and recommended Dysphagia 2 chopped meats.  Diet changed per speech therapy recommendations.  Will continue to monitor.  [name]

## 2011-04-11 NOTE — Progress Notes (Signed)
Utilization review completed.  

## 2011-04-11 NOTE — Progress Notes (Signed)
Speech Language/Pathology Clinical/Bedside Swallow Evaluation  Past Medical History:  Past Medical History  Diagnosis Date  . Hypertension   . Parkinson disease   . Arthritis    Past Surgical History:  Past Surgical History  Procedure Date  . Breast surgery    HPI:  77 y.o. female admitted after fall, sustaining right hip fracture and undergoing ORIF 04/10/11.  Reported swallowing difficulty to RN.  Hx of Parkinson's dz and occasional coughing with POs PTA, as well as difficullty masticating solid foods.   Assessment/Recommendations/Treatment Plan   Clinical Impression: Pt presents with mild dysphagia, which appears to be consistent with baseline function.  She has difficulty masticating solids, and describes her diet at home as consisting of soft foods and [employer].  She would like to try a Dysphagia 2 diet, hoping this consistency will be easier for her to manage.  Thin liquids consumed in large boluses elicited a consistent cough response.  The cough was eliminated by pt taking more care to consume smaller liquid boluses.   Pt appears to be managing POs sufficiently given age, frailty, and recent surgery.  No SLP f/u recommended.  Recommendations 1.  Dysphagia 2 (Fine chop)-per preferences; diet may be advanced per pt's wishes 2.  Thin liquids - small sips using straw 3.  Whole meds with puree 4.  No supervision with meal - assist with set-up 5.  No SLP f/u - will sign off.   Jeffery Bachmeier L. Samson Frederic, [place] CCC/SLP Pager [phone]      [name] 04/11/2011,1:22 PM

## 2011-04-12 LAB — TYPE AND SCREEN
ABO/RH(D): O POS
Antibody Screen: NEGATIVE

## 2011-04-12 LAB — CBC
HCT: 29.5 % — ABNORMAL LOW (ref 36.0–46.0)
Hemoglobin: 9.7 g/dL — ABNORMAL LOW (ref 12.0–15.0)
MCHC: 32.9 g/dL (ref 30.0–36.0)
RDW: 15 % (ref 11.5–15.5)
WBC: 6.7 10*3/uL (ref 4.0–10.5)

## 2011-04-12 LAB — BASIC METABOLIC PANEL
BUN: 16 mg/dL (ref 6–23)
Chloride: 107 mEq/L (ref 96–112)
GFR calc Af Amer: >90 mL/min (ref >90)
GFR calc non Af Amer: 78 mL/min — ABNORMAL LOW (ref >90)
Potassium: 3.8 mEq/L (ref 3.5–5.1)

## 2011-04-12 NOTE — Progress Notes (Signed)
Subjective: 2 Days Post-Op Procedure(s) (LRB): COMPRESSION HIP (Right) Received 1 unit prbc's. Oriented well. Complaining of very little pain Activity level: has been bed to chair Diet tolerance:eating chopped diet Voiding with or without catheter:cath out and voiding Patient reports pain as 1 on 0-10 scale.    Objective: Vital signs in last 24 hours: Temp:  [97.7 F (36.5 C)-98.7 F (37.1 C)] 98.6 F (37 C) (12/25 0500) Pulse Rate:  [62-81] 63  (12/25 0500) Resp:  [14-20] 19  (12/25 0500) BP: (86-118)/(40-68) 95/52 mmHg (12/25 0500) SpO2:  [98 %-100 %] 100 % (12/25 0500) FiO2 (%):  [2 %] 2 % (12/24 1400)  Intake/Output from previous day: 12/24 0701 - 12/25 0700 In: 2085 [P.O.:800; I.V.:940; Blood:345] Out: 400 [Urine:400] Intake/Output this shift: Total I/O In: 480 [P.O.:480] Out: 900 [Urine:900]   Basename 04/12/11 0346 04/11/11 1628 04/11/11 0655 04/10/11 0612 04/09/11 1630  HGB 9.7* 7.5* 9.2* 11.5* 13.8    Basename 04/12/11 0346 04/11/11 1628 04/11/11 0655  WBC 6.7 -- 7.0  RBC 3.21* -- 3.04*  HCT 29.5* 23.3* --  PLT 99* -- 130*    Basename 04/12/11 0346 04/11/11 0655  NA 139 138  K 3.8 3.4*  CL 107 104  CO2 27 28  BUN 16 16  CREATININE 0.57 0.67  GLUCOSE 103* 93  CALCIUM 8.2* 8.7    Basename 04/09/11 1630  LABPT --  INR 0.92   HGB dropped pt given 1 unit prbc Neurologically intact ABD soft Neurovascular intact Sensation intact distally Intact pulses distally Dorsiflexion/Plantar flexion intact No cellulitis present Compartment soft Right hip wnl , good n/v to toes. Dressing ok to be changed Wed. No sign of infection. Assessment/Plan: 2 Days Post-Op Procedure(s) (LRB): COMPRESSION HIP (Right) And BLA Advance diet Up with therapy and cont. Pwb. Staples out in 12 days. Will need to be seen in Dr. Nolon Nations office in 2-3 weeks. Patients son has picked out a SNF for her. Ok from ortho to go when med service gives ok.  [name]  R 04/12/2011, 12:15 PM

## 2011-04-12 NOTE — Progress Notes (Signed)
Subjective: Sitting in bed eating lunch. Feeling well. No complaints.  Objective: Filed Vitals:   04/12/11 0108 04/12/11 0201 04/12/11 0221 04/12/11 0500  BP: 86/42 96/58 118/52 95/52  Pulse: 62 63 67 63  Temp: 98.7 F (37.1 C) 97.9 F (36.6 C) 98.2 F (36.8 C) 98.6 F (37 C)  TempSrc: Oral Oral Oral Oral  Resp: 14 14 18 19   Height:      Weight:      SpO2:    100%   Weight change:   Intake/Output Summary (Last 24 hours) at 04/12/11 1331 Last data filed at 04/12/11 1100  Gross per 24 hour  Intake   1765 ml  Output   1300 ml  Net    465 ml    General: Alert, awake, oriented x3, in no acute distress.  HEENT: No bruits, no goiter.  Heart: Regular rate and rhythm, without murmurs, rubs, gallops.  Lungs: CTA, bilateral air movement.  Abdomen: Soft, nontender, nondistended, positive bowel sounds.  Neuro: Grossly intact, nonfocal. Extremities;n edema. Right LE no significant swelling.   Lab Results:  [facility] 04/12/11 0346 04/11/11 0655  NA 139 138  K 3.8 3.4*  CL 107 104  CO2 27 28  GLUCOSE 103* 93  BUN 16 16  CREATININE 0.57 0.67  CALCIUM 8.2* 8.7  MG -- --  PHOS -- --    Basename 04/09/11 1630  AST 25  ALT REPEATED TO VERIFY  ALKPHOS 73  BILITOT 0.8  PROT 7.1  ALBUMIN 3.7   No results found for this basename: LIPASE:2,AMYLASE:2 in the last 72 hours  Basename 04/12/11 0346 04/11/11 1628 04/11/11 0655 04/09/11 1630  WBC 6.7 -- 7.0 --  NEUTROABS -- -- -- 8.8*  HGB 9.7* 7.5* -- --  HCT 29.5* 23.3* -- --  MCV 91.9 -- 94.4 --  PLT 99* -- 130* --    Basename 04/09/11 2346 04/09/11 1630  CKTOTAL 102 --  CKMB 4.6* --  CKMBINDEX -- --  TROPONINI <0.30 <0.30    Micro Results: Recent Results (from the past 240 hour(s))  URINE CULTURE     Status: Normal   Collection Time   04/09/11  5:00 PM      Component Value Range Status Comment   Specimen Description URINE, CATHETERIZED   Final    Special Requests NONE   Final    Setup Time [id number]   Final      Colony Count NO GROWTH   Final    Culture NO GROWTH   Final    Report Status 2011/04/17 FINAL   Final     Studies/Results: Dg Femur Right  04/17/2011  *RADIOLOGY REPORT*  Clinical Data: Fracture fixation.  RIGHT FEMUR - 2 VIEW  Comparison: Plain films right hip 04/09/2011.  Findings: We are provided with four fluoroscopic spot views of the right femur.  Images demonstrate placement of a dynamic hip screw and long IM nail with a single of the distal interlocking screw for fixation of an intertrochanteric fracture.  IMPRESSION: ORIF right intertrochanteric fracture.  Original Report Authenticated By: Bernadene Bell. D'ALESSIO, M.D.   Dg C-arm 1-60 Min  April 17, 2011  CLINICAL DATA: FEMUR   C-ARM 1-60 MINUTES  Fluoroscopy was utilized by the requesting physician.  No radiographic  interpretation.      Medications: I have reviewed the patient's current medications.  Assessment:  1. Right Hip Fracture  S/P right hip trochanteric nail.  Post surgery 04-17-2023.  PT per ortho.  2. Mechanical Fall  PT, OT. Patient  would need short-term rehabilitation.  3. Parkinsons Disease  Continue with Carbidopa.  4. DVT Prophylaxis; Lovenox.  5-Hypertension: I will continue to hold HCTZ due Soft SBP. Marland Kitchen  6.Hypotension: Asymptomatic. We'll continue with IV fluids. No evidence of infection. We'll avoid opioids. Give Tylenol for pain. Anemia, post operative blood loss, expected.  Hb decrease at 7. S/P 1 unit PRBC. Hb today at 9. Disoposicion: OK to DC to SNF 12-26 if hb and BP stable.  Thrombocytopenia: monitor.       LOS: 3 days   [name] M.D.  [facility] 04/12/2011, 1:31 PM

## 2011-04-13 LAB — BASIC METABOLIC PANEL
CO2: 27 mEq/L (ref 19–32)
Chloride: 111 mEq/L (ref 96–112)
Glucose, Bld: 103 mg/dL — ABNORMAL HIGH (ref 70–99)
Sodium: 142 mEq/L (ref 135–145)

## 2011-04-13 LAB — CBC
Hemoglobin: 9.2 g/dL — ABNORMAL LOW (ref 12.0–15.0)
Platelets: 128 10*3/uL — ABNORMAL LOW (ref 150–400)
RBC: 3.03 MIL/uL — ABNORMAL LOW (ref 3.87–5.11)
WBC: 7.5 10*3/uL (ref 4.0–10.5)
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
## 2011-04-14 ENCOUNTER — Encounter (HOSPITAL_COMMUNITY): Payer: Self-pay | Admitting: Orthopaedic Surgery

## 2011-04-14 ENCOUNTER — Inpatient Hospital Stay
Admission: RE | Admit: 2011-04-14 | Discharge: 2011-04-26 | Disposition: A | Discharge status: Other Acute Inpatient Hospital | Payer: Medicare Other | Source: Ambulatory Visit | Attending: Pulmonary Disease | Admitting: Pulmonary Disease

## 2011-04-14 DIAGNOSIS — G20 Parkinson's disease: Secondary | ICD-10-CM | POA: Diagnosis present

## 2011-04-14 DIAGNOSIS — S72009A Fracture of unspecified part of neck of unspecified femur, initial encounter for closed fracture: Secondary | ICD-10-CM | POA: Diagnosis present

## 2011-04-14 DIAGNOSIS — I10 Essential (primary) hypertension: Secondary | ICD-10-CM | POA: Diagnosis present

## 2011-04-14 LAB — CBC
HCT: 27.6 % — ABNORMAL LOW (ref 36.0–46.0)
Hemoglobin: 8.8 g/dL — ABNORMAL LOW (ref 12.0–15.0)
MCV: 92 fL (ref 78.0–100.0)
RBC: 3 MIL/uL — ABNORMAL LOW (ref 3.87–5.11)
WBC: 7.3 10*3/uL (ref 4.0–10.5)

## 2011-04-14 MED ORDER — ACETAMINOPHEN 325 MG PO TABS: 650.0000 mg | ORAL_TABLET | Freq: Four times a day (QID) | ORAL | Status: AC | PRN

## 2011-04-14 MED ORDER — ONDANSETRON HCL 4 MG PO TABS: 4.0000 mg | ORAL_TABLET | Freq: Four times a day (QID) | ORAL | Status: AC | PRN

## 2011-04-14 MED ORDER — ENOXAPARIN SODIUM 30 MG/0.3ML ~~LOC~~ SOLN
30.0000 mg | Freq: Two times a day (BID) | SUBCUTANEOUS | Status: AC
Start: 2011-04-14 — End: 2011-04-28

## 2011-04-14 NOTE — Discharge Summary (Signed)
Patient ID: [name] MRN: [id number] DOB/AGE: 27-May-1916 77 y.o. Primary Care Physician:HAWKINS,EDWARD L, MD, MD Admit date: 04/09/2011 Discharge date: 04/14/2011    Discharge Diagnoses:  1. Right Hip Fracture S/P right hip trochanteric nail  2.Acute blood loss post surgery  3.Mechanical Fall  4.Hypotension, resolved.   Medication List  As of 04/14/2011 10:05 AM   START taking these medications         acetaminophen 325 MG tablet   Commonly known as: TYLENOL   Take 2 tablets (650 mg total) by mouth every 6 (six) hours as needed (or Fever >/= 101).      enoxaparin 30 MG/0.3ML Soln   Commonly known as: LOVENOX   Inject 0.3 mLs (30 mg total) into the skin every 12 (twelve) hours.      ondansetron 4 MG tablet   Commonly known as: ZOFRAN   Take 1 tablet (4 mg total) by mouth every 6 (six) hours as needed for nausea.         CONTINUE taking these medications         aspirin EC 81 MG tablet      carbidopa-levodopa 25-100 MG per tablet   Commonly known as: SINEMET      cetirizine 10 MG tablet   Commonly known as: ZYRTEC      Vitamin D3 400 UNITS Caps         STOP taking these medications         hydrochlorothiazide 25 MG tablet          Where to get your medications    These are the prescriptions that you need to pick up. We sent them to a specific pharmacy, so you will need to go there to get them.   [facility] - [place], [place] - [address]    [address] [place]  [address]    Phone: [phone]        ondansetron 4 MG tablet         Information on where to get these meds is not yet available. Ask your nurse or doctor.         acetaminophen 325 MG tablet   enoxaparin 30 MG/0.3ML Soln            Discharged Condition: good. Alert and oriented x3      Consults: [facility] orthopedics  Significant Diagnostic Studies: Dg Hip Complete Right  04/09/2011  *RADIOLOGY REPORT*  Clinical Data: Right hip  pain post fall  RIGHT HIP - COMPLETE 2+ VIEW  Comparison: None  Findings: Minimally displaced intertrochanteric fracture right femur. Osseous demineralization. Hip and SI joint spaces symmetric and preserved. No dislocation identified. Bony pelvis appears intact. Pelvic phleboliths bilaterally.  IMPRESSION: Minimally displaced intertrochanteric fracture right femur.  Per CMS PQRS reporting requirements (PQRS Measure 24): Given the patient's age of greater than 50 and the fracture site (hip, distal radius, or spine), the patient should be tested for osteoporosis using DXA, and the appropriate treatment considered based on the DXA results.  Original Report Authenticated By: Lollie Marrow, M.D.   Dg Femur Right  04/10/2011  *RADIOLOGY REPORT*  Clinical Data: Fracture fixation.  RIGHT FEMUR - 2 VIEW  Comparison: Plain films right hip 04/09/2011.  Findings: We are provided with four fluoroscopic spot views of the right femur.  Images demonstrate placement of a dynamic hip screw and long IM nail with a single of the distal interlocking screw for fixation of an intertrochanteric fracture.  IMPRESSION: ORIF right intertrochanteric fracture.  Original Report Authenticated By: Bernadene Bell. D'ALESSIO, M.D.   Dg Femur Right  04/09/2011  *RADIOLOGY REPORT*  Clinical Data: Right hip and femoral pain post fall  RIGHT FEMUR - 2 VIEW  Comparison: Right hip radiographs 04/09/2011  Findings: Intertrochanteric fracture right femur reported separately. Marked bony demineralization. Diffuse joint space narrowing right knee. Chondrocalcinosis right knee question CPPD/pseudogout. No additional fracture, dislocation, or bone destruction.  IMPRESSION: Intertrochanteric fracture right femur reported separately. Osseous demineralization with question CPPD/pseudogout right knee. No additional acute right femoral abnormalities.  Original Report Authenticated By: Lollie Marrow, M.D.   Dg Chest Portable 1 View  04/09/2011  *RADIOLOGY  REPORT*  Clinical Data: Fall, preop.  PORTABLE CHEST - 1 VIEW  Comparison: None  Findings: Heart is borderline in size.  Lungs are clear.  No effusions or acute bony abnormality.  IMPRESSION: No active disease.  Original Report Authenticated By: Cyndie Chime, M.D.   Dg C-arm 1-60 Min  04/10/2011  CLINICAL DATA: FEMUR   C-ARM 1-60 MINUTES  Fluoroscopy was utilized by the requesting physician.  No radiographic  interpretation.      Lab Results: Results for orders placed during the hospital encounter of 04/09/11 (from the past 48 hour(s))  CBC     Status: Abnormal   Collection Time   04/13/11  5:00 AM      Component Value Range Comment   WBC 7.5  4.0 - 10.5 (K/uL)    RBC 3.03 (*) 3.87 - 5.11 (MIL/uL)    Hemoglobin 9.2 (*) 12.0 - 15.0 (g/dL)    HCT 16.1 (*) 09.6 - 46.0 (%)    MCV 92.1  78.0 - 100.0 (fL)    MCH 30.4  26.0 - 34.0 (pg)    MCHC 33.0  30.0 - 36.0 (g/dL)    RDW 04.5  40.9 - 81.1 (%)    Platelets 128 (*) 150 - 400 (K/uL)   BASIC METABOLIC PANEL     Status: Abnormal   Collection Time   04/13/11  5:00 AM      Component Value Range Comment   Sodium 142  135 - 145 (mEq/L)    Potassium 3.5  3.5 - 5.1 (mEq/L)    Chloride 111  96 - 112 (mEq/L)    CO2 27  19 - 32 (mEq/L)    Glucose, Bld 103 (*) 70 - 99 (mg/dL)    BUN 10  6 - 23 (mg/dL)    Creatinine, Ser 9.14 (*) 0.50 - 1.10 (mg/dL)    Calcium 8.2 (*) 8.4 - 10.5 (mg/dL)    GFR calc non Af Amer 82 (*) >90 (mL/min)    GFR calc Af Amer >90  >90 (mL/min)   CBC     Status: Abnormal   Collection Time   04/14/11  6:00 AM      Component Value Range Comment   WBC 7.3  4.0 - 10.5 (K/uL)    RBC 3.00 (*) 3.87 - 5.11 (MIL/uL)    Hemoglobin 8.8 (*) 12.0 - 15.0 (g/dL)    HCT 78.2 (*) 95.6 - 46.0 (%)    MCV 92.0  78.0 - 100.0 (fL)    MCH 29.3  26.0 - 34.0 (pg)    MCHC 31.9  30.0 - 36.0 (g/dL)    RDW 21.3  08.6 - 57.8 (%)    Platelets 164  150 - 400 (K/uL)    Recent Results (from the past 240 hour(s))  URINE CULTURE  Status:  Normal   Collection Time   04/09/11  5:00 PM      Component Value Range Status Comment   Specimen Description URINE, CATHETERIZED   Final    Special Requests NONE   Final    Setup Time [id number]   Final    Colony Count NO GROWTH   Final    Culture NO GROWTH   Final    Report Status 04/10/2011 FINAL   Final      Hospital Course:  1. Right Hip Fracture  Patient was admitted after a mechanical fall and was found to have a right hip fracture. She had right hip trochanteric nail on December 23 . Patient has been evaluated by physical therapy and recommend short-term skilled nursing facility.  DVT prophylaxis per orthopedic. With lovenox [place] for 2 weeks.  2. Mechanical Fall  PT, OT. Patient would need short-term rehabilitation.  3. Parkinsons Disease  Continue with Carbidopa.  4-Hypertension: Patient become hypotensive post surgery. Hydrochlorothiazide was discontinued. Patient blood pressure improved with IV fluids.  6.Hypotension: Post surgery. Resolved with IV fluid and 1 unit PRBC transfusion. No evidence of infection.  7.Anemia, post operative blood loss. Hb decrease to 7. Patient received 1 unit of packed red blood cell on December 24. Hb today at 9 post transfusion. Hemoglobin has remained stable at 8.8 at discharge 8.Thrombocytopenia: resolved prior to DC      Discharge Exam: Blood pressure 91/47, pulse 64, temperature 97.8 F (36.6 C), temperature source Oral, resp. rate 24, height 5\' 2"  (1.575 m), weight 49.2 kg (108 lb 7.5 oz), SpO2 98.00%. Alert and oriented x3 CTAB RRR Abdomen soft NT Right hip site clean and without any bleeding or hematoma.   Disposition: SNF  Discharge Orders    Future Orders Please Complete By Expires   DIET DYS 2      Increase activity slowly         Follow-up Information    Follow up with Velna Ochs, MD. Make an appointment in 18 days.   Contact information:   [address] [place] [address] [phone]            Signed: Lonia Blood 04/14/2011, 10:05 AM

## 2011-04-14 NOTE — Progress Notes (Signed)
Patient has been approved today for SNF transfer to [facility]. EMS and SNF auth rec'd - [id number]. Patient and son agreeable to plans- plan tx via EMS. Reece Levy, MSW, Theresia Majors [phone]

## 2011-04-14 NOTE — Progress Notes (Signed)
   CARE MANAGEMENT NOTE 04/14/2011  Patient:  [name],[name]   Account Number:  [id number]  Date Initiated:  04/11/2011  Documentation initiated by:  Donn Pierini  Subjective/Objective Assessment:   Pt admitted with right hip fx.  s/p repair     Action/Plan:   PTA pt lived at home alone, was independent with ADLs PT/OT evals post op   Anticipated DC Date:  04/13/2011   Anticipated DC Plan:  SKILLED NURSING FACILITY  In-house referral  Clinical Social Worker      DC Planning Services  CM consult      Choice offered to / List presented to:             Status of service:  Completed, signed off Medicare Important Message given?   (If response is "NO", the following Medicare IM given date fields will be blank) Date Medicare IM given:   Date Additional Medicare IM given:    Discharge Disposition:  SKILLED NURSING FACILITY  Per UR Regulation:  Reviewed for med. necessity/level of care/duration of stay  Comments:  PCP- [place]  04/14/11- 1300- Donn Pierini RN, BSN [phone] Pt to d/c to [facility] today, CSW following for d/c needs  04/13/11- 1425- Cheyenne Schumm Charity fundraiser, BSN [phone] Pt awaiting SNF bed. CSW following for placement.  04/11/11- 1345- Donn Pierini RN BSN [phone] Discharge planning for SNF, CSW consulted for placement needs

## 2011-04-23 NOTE — H&P (Signed)
[name], [name]                 ACCOUNT NO.:  [id number]  MEDICAL RECORD NO.:  [id number]  LOCATION:  S127                          FACILITY:  [facility]  PHYSICIAN:  Cherish Runde L. Juanetta Gosling, M.D.DATE OF BIRTH:  1916/12/13  DATE OF ADMISSION:  04/14/2011 DATE OF DISCHARGE:  LH                             HISTORY & PHYSICAL   [name] is a 78 year old who had been in her usual state of fair health at home when she developed pain in her hip after a fall.  She had gone to open a door and the wind caught the door and pulled the door and knocked her down.  She was taken to [facility] where she had to have surgery for the fracture.  In addition to that, she has a history of Parkinson disease which has been doing a little bit better recently. She has a history of breast cancer.  She has an abnormal lesion on her kidney that may be a renal cell cancer, but has not changed much over the last year or so.  FAMILY HISTORY:  Noncontributory.  SOCIAL HISTORY:  She has been living at home.  She has a son nearby who provides her power of attorney.  She does not use alcohol.  She is a widow.  REVIEW OF SYSTEMS:  Except as mentioned is negative.  She says she is feeling better and able to move around some and she took 3 steps yesterday.  PHYSICAL EXAMINATION:  GENERAL:  She is awake and alert, sitting in a wheelchair and looks comfortable. HEART:  Regular. ABDOMEN:  Soft. EXTREMITIES:  No edema. CENTRAL NERVOUS SYSTEM:  Grossly intact.  Neurologic:  She is intact and her tremor does not appear to be as elevated as previously.  ASSESSMENT:  She has had a hip fracture.  She is improving.  She has had surgery.  PLAN:  For her to continue with her rehabilitation.  It is hoped that she will be able to return home after she finishes her rehabilitation.     Noralyn Karim L. Juanetta Gosling, M.D.    ELH/MEDQ  D:  04/23/2011  T:  04/23/2011  Job:  [id number]

## 2011-04-26 ENCOUNTER — Emergency Department (HOSPITAL_COMMUNITY)
Admission: EM | Admit: 2011-04-26 | Discharge: 2011-04-26 | Disposition: A | Discharge status: Home / Self Care | Payer: Medicare Other | Attending: Emergency Medicine | Admitting: Emergency Medicine

## 2011-04-26 ENCOUNTER — Encounter (HOSPITAL_COMMUNITY): Payer: Self-pay

## 2011-04-26 DIAGNOSIS — I10 Essential (primary) hypertension: Secondary | ICD-10-CM | POA: Insufficient documentation

## 2011-04-26 DIAGNOSIS — R42 Dizziness and giddiness: Secondary | ICD-10-CM | POA: Insufficient documentation

## 2011-04-26 DIAGNOSIS — G20A1 Parkinson's disease without dyskinesia, without mention of fluctuations: Secondary | ICD-10-CM | POA: Insufficient documentation

## 2011-04-26 DIAGNOSIS — M129 Arthropathy, unspecified: Secondary | ICD-10-CM | POA: Insufficient documentation

## 2011-04-26 DIAGNOSIS — G20 Parkinson's disease: Secondary | ICD-10-CM | POA: Insufficient documentation

## 2011-04-26 DIAGNOSIS — R059 Cough, unspecified: Secondary | ICD-10-CM | POA: Insufficient documentation

## 2011-04-26 DIAGNOSIS — R05 Cough: Secondary | ICD-10-CM | POA: Insufficient documentation

## 2011-04-26 DIAGNOSIS — R040 Epistaxis: Secondary | ICD-10-CM | POA: Insufficient documentation

## 2011-04-26 MED ORDER — OXYMETAZOLINE HCL 0.05 % NA SOLN
1.0000 | Freq: Once | NASAL | Status: AC
  Administered 2011-04-26: 1 via NASAL
  Filled 2011-04-26: qty 15

## 2011-04-26 NOTE — ED Notes (Signed)
Pt sent here from [facility] for eval of nosebleed

## 2011-04-26 NOTE — ED Provider Notes (Signed)
This chart was scribed for Flint Melter, MD by Williemae Natter. The patient was seen in room APA09/APA09 and the patient's care was started at 5:40 PM.  CSN: [id number]  Arrival date & time 04/26/11  1515   First MD Initiated Contact with Patient 04/26/11 1736      Chief Complaint  Patient presents with  . Epistaxis    (Consider location/radiation/quality/duration/timing/severity/associated sxs/prior treatment) HPI  [name] is a 78 y.o. female who presents to the Emergency Department complaining of epistaxis. Pt was brought here from [facility]. Pt has had a nosebleed since yesterday with associated coughing and mild dizziness upon standing. Nurse at snf put gauze in left naris to reduce bleeding. Pt denied any headache, weakness, nausea, vomiting, or chest pain.  Past Medical History  Diagnosis Date  . Hypertension   . Parkinson disease   . Arthritis     Past Surgical History  Procedure Date  . Breast surgery   . Compression hip screw 04/10/2011    Procedure: COMPRESSION HIP;  Surgeon: Velna Ochs, MD;  Location: [facility] OR;  Service: Orthopedics;  Laterality: Right;  . Fracture surgery     History reviewed. No pertinent family history.  History  Substance Use Topics  . Smoking status: Never Smoker   . Smokeless tobacco: Not on file  . Alcohol Use: No    OB History    Grav Para Term Preterm Abortions TAB SAB Ect Mult Living                  Review of Systems 10 Systems reviewed and are negative for acute change except as noted in the HPI.  Allergies  Review of patient's allergies indicates no known allergies.  Home Medications     BP 119/45  Pulse 69  Temp(Src) 98.2 F (36.8 C) (Oral)  Resp 18  SpO2 97%  Physical Exam  Nursing note and vitals reviewed. Constitutional: She is oriented to person, place, and time. She appears well-developed and well-nourished.  HENT:  Head: Normocephalic and atraumatic.       No active bleeding from the  nose. There is a gauze packing in the left naris. Oropharynx is normal without posterior bleeding   Eyes: Conjunctivae and EOM are normal. Pupils are equal, round, and reactive to light.  Neck: Normal range of motion. Neck supple.  Cardiovascular: Normal heart sounds.   Pulmonary/Chest: Effort normal and breath sounds normal.  Abdominal: Soft. There is no tenderness. There is no guarding.  Neurological: She is alert and oriented to person, place, and time.  Skin: Skin is warm and dry.  Psychiatric: She has a normal mood and affect. Her behavior is normal. Judgment and thought content normal.    ED Course  Procedures (including critical care time) 6:32 PM Recheck:  packing removed, blew her nose, Afrin instilled, no bleeding 7:18 PM Recheck:No nasal bleeding.  Pt ready for discharge, prescribed a nasal spray.  Labs Reviewed - No data to display    1. Epistaxis       MDM  Nonspecific nose bleeding. Doubt significant volume loss, nasal polyp or nasal infection. Patient stable for discharge with symptomatic care.   I personally performed the services described in this documentation, which was scribed in my presence. The recorded information has been reviewed and considered.        Flint Melter, MD 04/26/11 [phone]

## 2011-10-12 ENCOUNTER — Other Ambulatory Visit: Payer: Self-pay | Admitting: *Deleted

## 2012-01-26 ENCOUNTER — Other Ambulatory Visit: Payer: Self-pay | Admitting: Oncology

## 2012-03-12 ENCOUNTER — Encounter (HOSPITAL_COMMUNITY): Payer: Self-pay | Admitting: Pharmacy Technician

## 2012-03-20 NOTE — Patient Instructions (Addendum)
   Your procedure is scheduled on: 03/27/2012  Report to [facility] at   6:15  AM.  Call this number if you have problems the morning of surgery: [phone]   Remember:   Do not drink or eat food:After Midnight.  :  Take these medicines the morning of surgery with A SIP OF WATER: Sinemet   Do not wear jewelry, make-up or nail polish.  Do not wear lotions, powders, or perfumes. You may wear deodorant.  Do not shave 48 hours prior to surgery. Men may shave face and neck.  Do not bring valuables to the hospital.  Contacts, dentures or bridgework may not be worn into surgery.  Leave suitcase in the car. After surgery it may be brought to your room.  For patients admitted to the hospital, checkout time is 11:00 AM the day of discharge.   Patients discharged the day of surgery will not be allowed to drive home.    Special Instructions: Shower using CHG 2 nights before surgery and the night before surgery.  If you shower the day of surgery use CHG.  Use special wash - you have one bottle of CHG for all showers.  You should use approximately 1/3 of the bottle for each shower.   Please read over the following fact sheets that you were given: Pain Booklet, MRSA Information, Surgical Site Infection Prevention and Care and Recovery After Surgery     PATIENT INSTRUCTIONS POST-ANESTHESIA  IMMEDIATELY FOLLOWING SURGERY:  Do not drive or operate machinery for the first twenty four hours after surgery.  Do not make any important decisions for twenty four hours after surgery or while taking narcotic pain medications or sedatives.  If you develop intractable nausea and vomiting or a severe headache please notify your doctor immediately.  FOLLOW-UP:  Please make an appointment with your surgeon as instructed. You do not need to follow up with anesthesia unless specifically instructed to do so.  WOUND CARE INSTRUCTIONS (if applicable):  Keep a dry clean dressing on the anesthesia/puncture wound site if  there is drainage.  Once the wound has quit draining you may leave it open to air.  Generally you should leave the bandage intact for twenty four hours unless there is drainage.  If the epidural site drains for more than 36-48 hours please call the anesthesia department.  QUESTIONS?:  Please feel free to call your physician or the hospital operator if you have any questions, and they will be happy to assist you.

## 2012-03-21 ENCOUNTER — Encounter (HOSPITAL_COMMUNITY): Payer: Self-pay

## 2012-03-21 ENCOUNTER — Encounter (HOSPITAL_COMMUNITY)
Admission: RE | Admit: 2012-03-21 | Discharge: 2012-03-21 | Disposition: A | Discharge status: Home / Self Care | Payer: Medicare Other | Source: Ambulatory Visit | Attending: Podiatry | Admitting: Podiatry

## 2012-03-21 LAB — BASIC METABOLIC PANEL
Chloride: 102 mEq/L (ref 96–112)
GFR calc Af Amer: 82 mL/min — ABNORMAL LOW (ref >90)
Potassium: 4.4 mEq/L (ref 3.5–5.1)

## 2012-03-21 LAB — CBC
HCT: 41 % (ref 36.0–46.0)
Platelets: 234 10*3/uL (ref 150–400)
RDW: 14.2 % (ref 11.5–15.5)
WBC: 8 10*3/uL (ref 4.0–10.5)

## 2012-03-21 LAB — SURGICAL PCR SCREEN: Staphylococcus aureus: NEGATIVE

## 2012-03-27 ENCOUNTER — Encounter (HOSPITAL_COMMUNITY)
Admission: RE | Disposition: A | Discharge status: Home / Self Care | Payer: Self-pay | Source: Ambulatory Visit | Attending: Podiatry

## 2012-03-27 ENCOUNTER — Encounter (HOSPITAL_COMMUNITY): Payer: Self-pay | Admitting: Anesthesiology

## 2012-03-27 ENCOUNTER — Encounter (HOSPITAL_COMMUNITY): Payer: Self-pay | Admitting: *Deleted

## 2012-03-27 ENCOUNTER — Ambulatory Visit (HOSPITAL_COMMUNITY)
Admission: RE | Admit: 2012-03-27 | Discharge: 2012-03-27 | Disposition: A | Discharge status: Home / Self Care | Payer: Medicare Other | Source: Ambulatory Visit | Attending: Podiatry | Admitting: Podiatry

## 2012-03-27 ENCOUNTER — Ambulatory Visit (HOSPITAL_COMMUNITY): Payer: Medicare Other | Admitting: Anesthesiology

## 2012-03-27 DIAGNOSIS — L97509 Non-pressure chronic ulcer of other part of unspecified foot with unspecified severity: Secondary | ICD-10-CM | POA: Insufficient documentation

## 2012-03-27 DIAGNOSIS — M2040 Other hammer toe(s) (acquired), unspecified foot: Secondary | ICD-10-CM | POA: Insufficient documentation

## 2012-03-27 DIAGNOSIS — Z01810 Encounter for preprocedural cardiovascular examination: Secondary | ICD-10-CM | POA: Insufficient documentation

## 2012-03-27 DIAGNOSIS — I10 Essential (primary) hypertension: Secondary | ICD-10-CM | POA: Insufficient documentation

## 2012-03-27 DIAGNOSIS — Z01812 Encounter for preprocedural laboratory examination: Secondary | ICD-10-CM | POA: Insufficient documentation

## 2012-03-27 SURGERY — TENOTOMY, FLEXOR
Anesthesia: Monitor Anesthesia Care | Site: Toe | Laterality: Left | Wound class: Clean

## 2012-03-27 MED ORDER — SODIUM CHLORIDE 0.9 % IR SOLN
Status: DC | PRN
  Administered 2012-03-27: 1000 mL

## 2012-03-27 MED ORDER — BUPIVACAINE HCL (PF) 0.5 % IJ SOLN
INTRAMUSCULAR | Status: AC
  Filled 2012-03-27: qty 30

## 2012-03-27 MED ORDER — CEFAZOLIN SODIUM-DEXTROSE 2-3 GM-% IV SOLR
2.0000 g | Freq: Once | INTRAVENOUS | Status: AC
  Administered 2012-03-27: 2 g via INTRAVENOUS

## 2012-03-27 MED ORDER — FENTANYL CITRATE 0.05 MG/ML IJ SOLN
INTRAMUSCULAR | Status: AC
  Filled 2012-03-27: qty 2

## 2012-03-27 MED ORDER — ONDANSETRON HCL 4 MG/2ML IJ SOLN: 4.0000 mg | Freq: Once | INTRAMUSCULAR | Status: DC | PRN

## 2012-03-27 MED ORDER — LACTATED RINGERS IV SOLN
INTRAVENOUS | Status: DC
  Administered 2012-03-27: 07:00:00 via INTRAVENOUS

## 2012-03-27 MED ORDER — PROPOFOL INFUSION 10 MG/ML OPTIME
INTRAVENOUS | Status: DC | PRN
  Administered 2012-03-27: 25 ug/kg/min via INTRAVENOUS

## 2012-03-27 MED ORDER — LIDOCAINE HCL (PF) 1 % IJ SOLN: INTRAMUSCULAR | Status: DC | PRN

## 2012-03-27 MED ORDER — PROPOFOL 10 MG/ML IV EMUL
INTRAVENOUS | Status: AC
  Filled 2012-03-27: qty 20

## 2012-03-27 MED ORDER — LIDOCAINE HCL (PF) 1 % IJ SOLN
INTRAMUSCULAR | Status: AC
  Filled 2012-03-27: qty 5

## 2012-03-27 MED ORDER — LIDOCAINE HCL (PF) 1 % IJ SOLN
INTRAMUSCULAR | Status: AC
  Filled 2012-03-27: qty 30

## 2012-03-27 MED ORDER — MIDAZOLAM HCL 2 MG/2ML IJ SOLN
1.0000 mg | INTRAMUSCULAR | Status: DC | PRN
  Administered 2012-03-27 (×2): 1 mg via INTRAVENOUS

## 2012-03-27 MED ORDER — MIDAZOLAM HCL 2 MG/2ML IJ SOLN
INTRAMUSCULAR | Status: AC
  Filled 2012-03-27: qty 2

## 2012-03-27 MED ORDER — BUPIVACAINE HCL (PF) 0.5 % IJ SOLN
INTRAMUSCULAR | Status: DC | PRN
  Administered 2012-03-27: 5 mL

## 2012-03-27 MED ORDER — FENTANYL CITRATE 0.05 MG/ML IJ SOLN
INTRAMUSCULAR | Status: DC | PRN
  Administered 2012-03-27: 12.5 ug via INTRAVENOUS

## 2012-03-27 MED ORDER — CEFAZOLIN SODIUM-DEXTROSE 2-3 GM-% IV SOLR
INTRAVENOUS | Status: AC
  Filled 2012-03-27: qty 50

## 2012-03-27 MED ORDER — HYDROCODONE-ACETAMINOPHEN 5-500 MG PO TABS
1.0000 | ORAL_TABLET | Freq: Four times a day (QID) | ORAL | Status: DC | PRN
Start: 2012-03-27 — End: 2014-01-02

## 2012-03-27 MED ORDER — FENTANYL CITRATE 0.05 MG/ML IJ SOLN: 25.0000 ug | INTRAMUSCULAR | Status: DC | PRN

## 2012-03-27 SURGICAL SUPPLY — 47 items
APL SKNCLS STERI-STRIP NONHPOA (GAUZE/BANDAGES/DRESSINGS) ×1
BAG HAMPER (MISCELLANEOUS) ×2 IMPLANT
BANDAGE CONFORM 2  STR LF (GAUZE/BANDAGES/DRESSINGS) ×1 IMPLANT
BANDAGE ELASTIC 4 VELCRO NS (GAUZE/BANDAGES/DRESSINGS) ×2 IMPLANT
BANDAGE ESMARK 4X12 BL STRL LF (DISPOSABLE) ×1 IMPLANT
BANDAGE GAUZE ELAST BULKY 4 IN (GAUZE/BANDAGES/DRESSINGS) ×2 IMPLANT
BENZOIN TINCTURE PRP APPL 2/3 (GAUZE/BANDAGES/DRESSINGS) ×2 IMPLANT
BLADE AVERAGE 25X9 (BLADE) IMPLANT
BLADE SURG 15 STRL LF DISP TIS (BLADE) ×2 IMPLANT
BLADE SURG 15 STRL SS (BLADE) ×4
BNDG CMPR 12X4 ELC STRL LF (DISPOSABLE) ×1
BNDG ESMARK 4X12 BLUE STRL LF (DISPOSABLE) ×2
CHLORAPREP W/TINT 26ML (MISCELLANEOUS) ×2 IMPLANT
CLOTH BEACON ORANGE TIMEOUT ST (SAFETY) ×2 IMPLANT
COVER LIGHT HANDLE STERIS (MISCELLANEOUS) ×4 IMPLANT
CUFF TOURN SGL LL 12 (TOURNIQUET CUFF) ×1 IMPLANT
CUFF TOURNIQUET SINGLE 18IN (TOURNIQUET CUFF) IMPLANT
DECANTER SPIKE VIAL GLASS SM (MISCELLANEOUS) ×2 IMPLANT
DRSG ADAPTIC 3X8 NADH LF (GAUZE/BANDAGES/DRESSINGS) ×2 IMPLANT
DURA STEPPER LG (CAST SUPPLIES) IMPLANT
DURA STEPPER MED (CAST SUPPLIES) IMPLANT
DURA STEPPER SML (CAST SUPPLIES) IMPLANT
DURA STEPPER XL (SOFTGOODS) IMPLANT
ELECT REM PT RETURN 9FT ADLT (ELECTROSURGICAL) ×2
ELECTRODE REM PT RTRN 9FT ADLT (ELECTROSURGICAL) ×1 IMPLANT
GLOVE BIO SURGEON STRL SZ7.5 (GLOVE) ×2 IMPLANT
GLOVE INDICATOR 7.0 STRL GRN (GLOVE) ×1 IMPLANT
GLOVE SS BIOGEL STRL SZ 6.5 (GLOVE) IMPLANT
GLOVE SUPERSENSE BIOGEL SZ 6.5 (GLOVE) ×1
GOWN STRL REIN XL XLG (GOWN DISPOSABLE) ×6 IMPLANT
KIT ROOM TURNOVER AP CYSTO (KITS) ×2 IMPLANT
MANIFOLD NEPTUNE II (INSTRUMENTS) ×2 IMPLANT
NDL HYPO 27GX1-1/4 (NEEDLE) ×3 IMPLANT
NEEDLE HYPO 27GX1-1/4 (NEEDLE) ×6 IMPLANT
NS IRRIG 1000ML POUR BTL (IV SOLUTION) ×2 IMPLANT
PACK BASIC LIMB (CUSTOM PROCEDURE TRAY) ×2 IMPLANT
PAD ARMBOARD 7.5X6 YLW CONV (MISCELLANEOUS) ×2 IMPLANT
RASP SM TEAR CROSS CUT (RASP) IMPLANT
SET BASIN LINEN APH (SET/KITS/TRAYS/PACK) ×2 IMPLANT
SPONGE GAUZE 4X4 12PLY (GAUZE/BANDAGES/DRESSINGS) ×2 IMPLANT
SPONGE LAP 18X18 X RAY DECT (DISPOSABLE) ×2 IMPLANT
STRIP CLOSURE SKIN 1/2X4 (GAUZE/BANDAGES/DRESSINGS) ×2 IMPLANT
SUT PROLENE 4 0 PS 2 18 (SUTURE) ×3 IMPLANT
SUT VIC AB 2-0 CT2 27 (SUTURE) IMPLANT
SUT VIC AB 4-0 PS2 27 (SUTURE) ×1 IMPLANT
SUT VICRYL AB 3-0 FS1 BRD 27IN (SUTURE) ×1 IMPLANT
SYR CONTROL 10ML LL (SYRINGE) ×4 IMPLANT

## 2012-03-27 NOTE — Anesthesia Preprocedure Evaluation (Signed)
Anesthesia Evaluation  Patient identified by MRN, date of birth, ID band Patient awake    Reviewed: Allergy & Precautions, H&P , NPO status , Patient's Chart, lab work & pertinent test results  Airway Mallampati: I TM Distance: >3 FB Neck ROM: Full    Dental  (+) Edentulous Upper and Edentulous Lower   Pulmonary neg pulmonary ROS,  breath sounds clear to auscultation  Pulmonary exam normal       Cardiovascular hypertension (HCTZ), Pt. on medications Rhythm:Regular Rate:Normal     Neuro/Psych Parkinson's diseaseParkinson's Dx    GI/Hepatic Neg liver ROS, GERD-  Medicated and Controlled,  Endo/Other  negative endocrine ROS  Renal/GU negative Renal ROS     Musculoskeletal   Abdominal   Peds  Hematology   Anesthesia Other Findings   Reproductive/Obstetrics                           Anesthesia Physical Anesthesia Plan  ASA: III  Anesthesia Plan: MAC   Post-op Pain Management:    Induction: Intravenous  Airway Management Planned: Nasal Cannula  Additional Equipment:   Intra-op Plan:   Post-operative Plan:   Informed Consent: I have reviewed the patients History and Physical, chart, labs and discussed the procedure including the risks, benefits and alternatives for the proposed anesthesia with the patient or authorized representative who has indicated his/her understanding and acceptance.     Plan Discussed with:   Anesthesia Plan Comments:         Anesthesia Quick Evaluation

## 2012-03-27 NOTE — Anesthesia Procedure Notes (Signed)
Procedure Name: MAC Date/Time: 03/27/2012 7:50 AM Performed by: Franco Nones Pre-anesthesia Checklist: Patient identified, Emergency Drugs available, Suction available, Timeout performed and Patient being monitored Patient Re-evaluated:Patient Re-evaluated prior to inductionOxygen Delivery Method: Nasal Cannula

## 2012-03-27 NOTE — Op Note (Signed)
OPERATIVE REPORT  SURGEON:   Dallas Schimke, [place]  OR STAFF:   Rogene Houston, RN - Circulator Hurshel Party, CST - Scrub Person   PREOPERATIVE DIAGNOSIS:   1.  Ulceration 3rd toe left foot 2.  Flexible hammer toe deformity left foot  POSTOPERATIVE DIAGNOSIS: Same  PROCEDURE: Flexor tenotomy 3rd toe left foot  ANESTHESIA:  Monitor Anesthesia Care   HEMOSTASIS:   Pneumatic ankle tourniquet set at 250 mmHg  ESTIMATED BLOOD LOSS:   Minimal (<5 cc)  MATERIALS USED:  None  INJECTABLES: Marcaine 0.5% plain; 5mL  PATHOLOGY:   None  COMPLICATIONS:   None  INDICATIONS:  Chronic nonhealing ulceration of the 3rd toe left foot.  PROCEDURE IN DETAIL: The patient was brought to the operating room and placed on the operative table in the supine position.  The pneumatic ankle tourniquet was placed about the patient's left ankle.  The foot was anesthetized using 0.5% Marcaine plain.  The foot was scrubbed prepped and draped in the usual sterile manner.  The limb was then elevated and exsanguinated and the pneumatic ankle tourniquet inflated to 250 mmHg.  Attention was directed to the plantar aspect of the left third toe.  A percutaneous flexor tenotomy was performed using a #15 blade.  The contracture on the third toe was noted to be reduced.  The wound was irrigated with copious amounts of sterile irrigant.  The skin was reapproximated using 4-0 Prolene in a simple suture technique.  A sterile compressive dressing was applied to the operative foot.  The pneumatic ankle tourniquet was deflated and a prompt hyperemic response was noted to all digits of the operative foot.  The patient tolerated the procedure and anesthesia well.  She was transferred from the operating room to the postanesthesia care unit with vital signs stable and vascular status intact to all digits.

## 2012-03-27 NOTE — H&P (Signed)
HISTORY AND PHYSICAL INTERVAL NOTE:  03/27/2012  7:38 AM  [name]  has presented today for surgery, with the diagnosis of ulceration 3rd toe left foot, flexible hammer toe deformity left foot.  The various methods of treatment have been discussed with the patient.  No guarantees were given.  After consideration of risks, benefits and other options for treatment, the patient has consented to surgery.  I have reviewed the patients' chart and labs.    Patient Vitals for the past 24 hrs:  BP Temp Temp src Pulse Resp SpO2  03/27/12 0651 159/54 mmHg 97.7 F (36.5 C) Oral 58  22  98 %    A history and physical examination was performed in my office on 03/21/2012.  The patient was reexamined.  There have been no changes to this history and physical examination.  Dallas Schimke, DPM

## 2012-03-27 NOTE — Brief Op Note (Signed)
BRIEF OPERATIVE NOTE  SURGEON:   Dallas Schimke, DPM  OR STAFF:   Rogene Houston, RN - Circulator Hurshel Party, CST - Scrub Person   PREOPERATIVE DIAGNOSIS:   1.  Ulceration 3rd toe left foot 2.  Flexible hammer toe deformity left foot  POSTOPERATIVE DIAGNOSIS: Same  PROCEDURE: Flexor tenotomy 3rd toe left foot  ANESTHESIA:  Monitor Anesthesia Care   HEMOSTASIS:   Pneumatic ankle tourniquet set at 250 mmHg  ESTIMATED BLOOD LOSS:   Minimal (<5 cc)  MATERIALS USED:  None  INJECTABLES: Marcaine 0.5% plain; 5mL  PATHOLOGY:   None  COMPLICATIONS:   None  INDICATIONS:  Chronic nonhealing ulceration of the 3rd toe left foot.  DICTATION:  Note written in EPIC

## 2012-03-27 NOTE — Transfer of Care (Signed)
Immediate Anesthesia Transfer of Care Note  Patient: [name]  Procedure(s) Performed: Procedure(s) (LRB): FLEXOR TENOTOMY (Left)  Patient Location: PACU  Anesthesia Type: MAC  Level of Consciousness: awake  Airway & Oxygen Therapy: Patient Spontanous Breathing. Nasal cannula  Post-op Assessment: Report given to PACU RN, Post -op Vital signs reviewed and stable and Patient moving all extremities  Post vital signs: Reviewed and stable  Complications: No apparent anesthesia complications

## 2012-03-27 NOTE — Anesthesia Postprocedure Evaluation (Signed)
Anesthesia Post Note  Patient: [name]  Procedure(s) Performed: Procedure(s) (LRB): FLEXOR TENOTOMY (Left)  Anesthesia type: MAC  Patient location: PACU  Post pain: Pain level controlled  Post assessment: Post-op Vital signs reviewed, Patient's Cardiovascular Status Stable, Respiratory Function Stable, Patent Airway, No signs of Nausea or vomiting and Pain level controlled  Last Vitals:  Filed Vitals:   03/27/12 0827  BP: 136/52  Pulse: 54  Temp: 36.4 C  Resp: 14    Post vital signs: Reviewed and stable  Level of consciousness: awake and alert   Complications: No apparent anesthesia complications

## 2012-11-13 ENCOUNTER — Other Ambulatory Visit: Payer: Self-pay | Admitting: Neurology

## 2012-11-13 DIAGNOSIS — R531 Weakness: Secondary | ICD-10-CM

## 2012-11-20 ENCOUNTER — Other Ambulatory Visit (HOSPITAL_COMMUNITY): Payer: Medicare Other

## 2012-11-20 ENCOUNTER — Ambulatory Visit (HOSPITAL_COMMUNITY): Payer: Medicare Other

## 2012-11-22 ENCOUNTER — Ambulatory Visit (HOSPITAL_COMMUNITY)
Admission: RE | Admit: 2012-11-22 | Discharge: 2012-11-22 | Disposition: A | Discharge status: Home / Self Care | Payer: Medicare Other | Source: Ambulatory Visit | Attending: Neurology | Admitting: Neurology

## 2012-11-22 DIAGNOSIS — R209 Unspecified disturbances of skin sensation: Secondary | ICD-10-CM | POA: Insufficient documentation

## 2012-11-22 DIAGNOSIS — I10 Essential (primary) hypertension: Secondary | ICD-10-CM | POA: Insufficient documentation

## 2012-11-22 DIAGNOSIS — G20A1 Parkinson's disease without dyskinesia, without mention of fluctuations: Secondary | ICD-10-CM | POA: Insufficient documentation

## 2012-11-22 DIAGNOSIS — R531 Weakness: Secondary | ICD-10-CM

## 2012-11-22 DIAGNOSIS — G319 Degenerative disease of nervous system, unspecified: Secondary | ICD-10-CM | POA: Insufficient documentation

## 2012-11-22 DIAGNOSIS — G20 Parkinson's disease: Secondary | ICD-10-CM | POA: Insufficient documentation

## 2012-11-22 DIAGNOSIS — R29898 Other symptoms and signs involving the musculoskeletal system: Secondary | ICD-10-CM | POA: Insufficient documentation

## 2014-01-02 ENCOUNTER — Emergency Department (HOSPITAL_COMMUNITY)
Admission: EM | Admit: 2014-01-02 | Discharge: 2014-01-02 | Disposition: A | Discharge status: Home / Self Care | Payer: Medicare Other | Attending: Emergency Medicine | Admitting: Emergency Medicine

## 2014-01-02 ENCOUNTER — Encounter (HOSPITAL_COMMUNITY): Payer: Self-pay | Admitting: Emergency Medicine

## 2014-01-02 DIAGNOSIS — Z791 Long term (current) use of non-steroidal anti-inflammatories (NSAID): Secondary | ICD-10-CM | POA: Diagnosis not present

## 2014-01-02 DIAGNOSIS — G20 Parkinson's disease: Secondary | ICD-10-CM | POA: Diagnosis not present

## 2014-01-02 DIAGNOSIS — G20A1 Parkinson's disease without dyskinesia, without mention of fluctuations: Secondary | ICD-10-CM | POA: Insufficient documentation

## 2014-01-02 DIAGNOSIS — Z7982 Long term (current) use of aspirin: Secondary | ICD-10-CM | POA: Insufficient documentation

## 2014-01-02 DIAGNOSIS — M129 Arthropathy, unspecified: Secondary | ICD-10-CM | POA: Diagnosis not present

## 2014-01-02 DIAGNOSIS — M2550 Pain in unspecified joint: Secondary | ICD-10-CM | POA: Insufficient documentation

## 2014-01-02 DIAGNOSIS — R040 Epistaxis: Secondary | ICD-10-CM | POA: Insufficient documentation

## 2014-01-02 DIAGNOSIS — Z79899 Other long term (current) drug therapy: Secondary | ICD-10-CM | POA: Insufficient documentation

## 2014-01-02 DIAGNOSIS — I10 Essential (primary) hypertension: Secondary | ICD-10-CM | POA: Diagnosis not present

## 2014-01-02 NOTE — ED Notes (Signed)
EDP and PA to bedside.

## 2014-01-02 NOTE — ED Provider Notes (Signed)
Medical screening examination/treatment/procedure(s) were conducted as a shared visit with non-physician practitioner(s) or resident and myself. I personally evaluated the patient during the encounter and agree with the findings.  I have personally reviewed any xrays and/ or EKG's with the provider and I agree with interpretation.  Patient history of high blood pressure, on preventative aspirin 81 mg presents with mild nosebleed left naris that has resolved upon arrival. Patient was observed in no recurrent bleeding. Patient well-appearing in no pre-syncope or syncope symptoms. On exam patient is mild dry blood bilateral nares, no hematoma, well-appearing. Discussed supportive care and reasons to return. Patient will hold aspirin until he sees her physician later this week.  Epistaxis   Margaret Clonts, MD 01/02/14 1454

## 2014-01-02 NOTE — ED Notes (Signed)
Pt co nose bleed that started this morning at 0800, blood in lt nare. Bleeding controlled at triage.

## 2014-01-02 NOTE — Discharge Instructions (Signed)
we have not observed any bleeding since your admission to the emergency department. If the bleeding should reoccur, please pinch the nose tightly for 15 minutes. Try this for 3 episodes. He'll bleeding continues, please return to the emergency department.  Nosebleed A nosebleed can be caused by many things, including:  Getting hit hard in the nose.  Infections.  Dry nose.  Colds.  Medicines. Your doctor may do lab testing if you get nosebleeds a lot and the cause is not known. HOME CARE   If your nose was packed with material, keep it there until your doctor takes it out. Put the pack back in your nose if the pack falls out.  Do not blow your nose for 12 hours after the nosebleed.  Sit up and bend forward if your nose starts bleeding again. Pinch the front half of your nose nonstop for 20 minutes.  Put petroleum jelly inside your nose every morning if you have a dry nose.  Use a humidifier to make the air less dry.  Do not take aspirin.  Try not to strain, lift, or bend at the waist for many days after the nosebleed. GET HELP RIGHT AWAY IF:   Nosebleeds keep happening and are hard to stop or control.  You have bleeding or bruises that are not normal on other parts of the body.  You have a fever.  The nosebleeds get worse.  You get lightheaded, feel faint, sweaty, or throw up (vomit) blood. MAKE SURE YOU:   Understand these instructions.  Will watch your condition.  Will get help right away if you are not doing well or get worse. Document Released: 01/12/2008 Document Revised: 06/27/2011 Document Reviewed: 01/12/2008 [facility] Patient Information 2015 [place], [place]. This information is not intended to replace advice given to you by your health care provider. Make sure you discuss any questions you have with your health care provider.

## 2014-01-02 NOTE — ED Provider Notes (Signed)
CSN: [id number]     Arrival date & time 01/02/14  0914 History   First MD Initiated Contact with Patient 01/02/14 0919     Chief Complaint  Patient presents with  . Epistaxis     (Consider location/radiation/quality/duration/timing/severity/associated sxs/prior Treatment) Patient is a 80 y.o. female presenting with nosebleeds. The history is provided by the patient.  Epistaxis Location:  L nare Severity:  Moderate Duration:  1 hour Timing:  Constant Progression:  Improving Chronicity:  New Context: aspirin use, hypertension and weather change   Context: not anticoagulants, not foreign body and not trauma   Relieved by:  Applying pressure Associated symptoms: no blood in oropharynx, no cough, no dizziness, no headaches, no sinus pain, no sneezing and no syncope   Risk factors: no alcohol use, no change in medication, no liver disease and no sinus problems     Past Medical History  Diagnosis Date  . Hypertension   . Parkinson disease   . Arthritis    Past Surgical History  Procedure Laterality Date  . Breast surgery    . Compression hip screw  04/10/2011    Procedure: COMPRESSION HIP;  Surgeon: Hessie Dibble, MD;  Location: [facility];  Service: Orthopedics;  Laterality: Right;  . Fracture surgery    . Abdominal hysterectomy    . Appendectomy     History reviewed. No pertinent family history. History  Substance Use Topics  . Smoking status: Never Smoker   . Smokeless tobacco: Not on file  . Alcohol Use: No   OB History   Grav Para Term Preterm Abortions TAB SAB Ect Mult Living                 Review of Systems  Constitutional: Negative for activity change.       All ROS Neg except as noted in HPI  HENT: Positive for nosebleeds. Negative for sneezing.   Eyes: Negative for photophobia and discharge.  Respiratory: Negative for cough, shortness of breath and wheezing.   Cardiovascular: Negative for chest pain, palpitations and syncope.  Gastrointestinal: Negative  for abdominal pain and blood in stool.  Genitourinary: Negative for dysuria, frequency and hematuria.  Musculoskeletal: Positive for arthralgias. Negative for back pain and neck pain.  Skin: Negative.   Neurological: Negative for dizziness, seizures, speech difficulty and headaches.  Psychiatric/Behavioral: Negative for hallucinations and confusion.      Allergies  Review of patient's allergies indicates no known allergies.  Home Medications   Prior to Admission medications   Medication Sig Start Date End Date Taking? Authorizing Provider  aspirin EC 81 MG tablet Take 81 mg by mouth every morning.     Historical Provider, MD  carbidopa-levodopa (SINEMET) 25-100 MG per tablet Take 1 tablet by mouth 4 (four) times daily.      Historical Provider, MD  cetirizine (ZYRTEC) 10 MG tablet Take 10 mg by mouth every morning.     Historical Provider, MD  Cholecalciferol (VITAMIN D3) 400 UNITS CAPS Take 1 capsule by mouth every morning.     Historical Provider, MD  HYDROcodone-acetaminophen (VICODIN) 5-500 MG per tablet Take 1 tablet by mouth every 6 (six) hours as needed for pain. 03/27/12   Marcheta Grammes, DPM  ibuprofen (ADVIL,MOTRIN) 200 MG tablet Take 400 mg by mouth every 6 (six) hours as needed. Arthritis.    Historical Provider, MD   BP 117/99  Pulse 75  Temp(Src) 98.1 F (36.7 C) (Oral)  Resp 18  Ht 5\' 2"  (1.575 m)  Wt 126 lb (57.153 kg)  BMI 23.04 kg/m2  SpO2 97% Physical Exam  Nursing note and vitals reviewed. Constitutional: She is oriented to person, place, and time. She appears well-developed and well-nourished.  Non-toxic appearance.  HENT:  Head: Normocephalic.  Right Ear: Tympanic membrane and external ear normal.  Left Ear: Tympanic membrane and external ear normal.  Dry blood in the nares. There is no hot areas were swollen areas in the sinuses. There is no deformity or evidence of trauma to the nose. There is no deformity or lesions involving the septum.   Eyes: EOM and lids are normal. Pupils are equal, round, and reactive to light.  Neck: Normal range of motion. Neck supple. Carotid bruit is not present.  Cardiovascular: Normal rate, regular rhythm, normal heart sounds, intact distal pulses and normal pulses.   Pulmonary/Chest: Breath sounds normal. No respiratory distress. She has no wheezes. She has no rales.  Abdominal: Soft. Bowel sounds are normal. There is no tenderness. There is no guarding.  Musculoskeletal: Normal range of motion.  Lymphadenopathy:       Head (right side): No submandibular adenopathy present.       Head (left side): No submandibular adenopathy present.    She has no cervical adenopathy.  Neurological: She is alert and oriented to person, place, and time. She has normal strength. No cranial nerve deficit or sensory deficit.  Skin: Skin is warm and dry.  Psychiatric: She has a normal mood and affect. Her speech is normal.    ED Course  Patient seen with me by Dr. Reather Converse.  Procedures (including critical care time) Labs Review Labs Reviewed - No data to display  Imaging Review No results found.   EKG Interpretation None      MDM  For nosebleeds stopped upon patient's arrival to the emergency department. Patient observed for over an hour here in the emergency department and no recurrence of bleeding. The patient is not on any anticoagulation medications. Patient does take 81 mg aspirin daily. Blood pressure 117/99. Patient and family advised to monitor the blood pressure closely. I've given the patient instructions on returning if bleeding reoccurs. I've given the patient and family instructions on pinching and holding the nose in the event the bleeding should recur. Day knowledge understanding of these discharge instructions.    Final diagnoses:  None    *I have reviewed nursing notes, vital signs, and all appropriate lab and imaging results for this patient.Vonda Antigua,  PA-C 01/02/14 1059

## 2014-02-13 ENCOUNTER — Ambulatory Visit (INDEPENDENT_AMBULATORY_CARE_PROVIDER_SITE_OTHER): Payer: Medicare Other | Admitting: Otolaryngology

## 2014-02-13 DIAGNOSIS — R040 Epistaxis: Secondary | ICD-10-CM

## 2014-03-20 ENCOUNTER — Ambulatory Visit (INDEPENDENT_AMBULATORY_CARE_PROVIDER_SITE_OTHER): Payer: Medicare Other | Admitting: Otolaryngology

## 2014-09-26 ENCOUNTER — Emergency Department (HOSPITAL_COMMUNITY)
Admission: EM | Admit: 2014-09-26 | Discharge: 2014-09-26 | Disposition: A | Discharge status: Home / Self Care | Payer: Medicare Other | Attending: Emergency Medicine | Admitting: Emergency Medicine

## 2014-09-26 ENCOUNTER — Encounter (HOSPITAL_COMMUNITY): Payer: Self-pay | Admitting: *Deleted

## 2014-09-26 DIAGNOSIS — R21 Rash and other nonspecific skin eruption: Secondary | ICD-10-CM | POA: Diagnosis present

## 2014-09-26 DIAGNOSIS — I10 Essential (primary) hypertension: Secondary | ICD-10-CM | POA: Diagnosis not present

## 2014-09-26 DIAGNOSIS — Z7982 Long term (current) use of aspirin: Secondary | ICD-10-CM | POA: Diagnosis not present

## 2014-09-26 DIAGNOSIS — G20 Parkinson's disease: Secondary | ICD-10-CM | POA: Diagnosis not present

## 2014-09-26 DIAGNOSIS — M791 Myalgia: Secondary | ICD-10-CM | POA: Insufficient documentation

## 2014-09-26 DIAGNOSIS — B029 Zoster without complications: Secondary | ICD-10-CM | POA: Diagnosis not present

## 2014-09-26 DIAGNOSIS — M1990 Unspecified osteoarthritis, unspecified site: Secondary | ICD-10-CM | POA: Diagnosis not present

## 2014-09-26 DIAGNOSIS — Z79899 Other long term (current) drug therapy: Secondary | ICD-10-CM | POA: Insufficient documentation

## 2014-09-26 MED ORDER — VALACYCLOVIR HCL 1 G PO TABS: 1000.0000 mg | ORAL_TABLET | Freq: Three times a day (TID) | ORAL | Status: AC

## 2014-09-26 NOTE — ED Notes (Deleted)
Red blistery rahs under right breast radiating into back area

## 2014-09-26 NOTE — ED Notes (Signed)
Swelling in both ankle also, unable to wear regular shoes

## 2014-09-26 NOTE — Discharge Instructions (Signed)

## 2014-09-26 NOTE — ED Notes (Signed)
Red blistery rash under right back radiating into back

## 2014-09-26 NOTE — ED Provider Notes (Signed)
CSN: [id number]     Arrival date & time 09/26/14  1518 History   First MD Initiated Contact with Patient 09/26/14 1811     Chief Complaint  Patient presents with  . Rash     (Consider location/radiation/quality/duration/timing/severity/associated sxs/prior Treatment) HPI   [name] is a 81 y.o. female who presents to the Emergency Department complaining of painful rash to right back and under right breast that began 3 days ago.  She describes the rash as "blisters" and reports constant sharp pain.  She also reports swelling to both feet for few days.  She denies medication changes, shortness of breath, swelling of her legs and chest pain. Pt reports previous outbreak of shingles "years ago"   Past Medical History  Diagnosis Date  . Hypertension   . Parkinson disease   . Arthritis    Past Surgical History  Procedure Laterality Date  . Breast surgery    . Compression hip screw  04/10/2011    Procedure: COMPRESSION HIP;  Surgeon: Hessie Dibble, MD;  Location: [facility];  Service: Orthopedics;  Laterality: Right;  . Fracture surgery    . Abdominal hysterectomy    . Appendectomy     No family history on file. History  Substance Use Topics  . Smoking status: Never Smoker   . Smokeless tobacco: Not on file  . Alcohol Use: No   OB History    No data available     Review of Systems  Constitutional: Negative for fever, chills, activity change and appetite change.  HENT: Negative for facial swelling, sore throat and trouble swallowing.   Respiratory: Negative for chest tightness, shortness of breath and wheezing.   Gastrointestinal: Negative for nausea and vomiting.  Musculoskeletal: Positive for myalgias. Negative for neck pain and neck stiffness.       Swelling to bilateral feet  Skin: Positive for rash. Negative for wound.  Neurological: Negative for dizziness, weakness, numbness and headaches.  All other systems reviewed and are negative.     Allergies  Review of  patient's allergies indicates no known allergies.  Home Medications   Prior to Admission medications   Medication Sig Start Date End Date Taking? Authorizing Provider  aspirin EC 81 MG tablet Take 81 mg by mouth every morning.     Historical Provider, MD  carbidopa-levodopa (SINEMET) 25-100 MG per tablet Take 1 tablet by mouth 4 (four) times daily.      Historical Provider, MD  cetirizine (ZYRTEC) 10 MG tablet Take 10 mg by mouth daily.    Historical Provider, MD  Cholecalciferol (VITAMIN D3) 400 UNITS CAPS Take 1 capsule by mouth every morning.     Historical Provider, MD  ibuprofen (ADVIL,MOTRIN) 200 MG tablet Take 200 mg by mouth 3 (three) times daily. Arthritis.    Historical Provider, MD  valACYclovir (VALTREX) 1000 MG tablet Take 1 tablet (1,000 mg total) by mouth 3 (three) times daily. For 7 days 09/26/14 10/10/14  Almetta Liddicoat, PA-C   BP 189/98 mmHg  Pulse 66  Temp(Src) 97.7 F (36.5 C) (Oral)  Resp 22  Ht 5' (1.524 m)  Wt 120 lb (54.432 kg)  BMI 23.44 kg/m2  SpO2 100% Physical Exam  Constitutional: She is oriented to person, place, and time. She appears well-developed and well-nourished. No distress.  HENT:  Head: Normocephalic and atraumatic.  Mouth/Throat: Oropharynx is clear and moist.  Neck: Normal range of motion. Neck supple.  Cardiovascular: Normal rate, regular rhythm, normal heart sounds and intact distal pulses.  No murmur heard. Pulmonary/Chest: Effort normal and breath sounds normal. No respiratory distress. She exhibits no tenderness.  Musculoskeletal: She exhibits no edema or tenderness.  Mild edema to bilateral dorsal feet.  No edema proximal to the feet.  DP pulses brisk.    Lymphadenopathy:    She has no cervical adenopathy.  Neurological: She is alert and oriented to person, place, and time. She exhibits normal muscle tone. Coordination normal.  Skin: Skin is warm. Rash noted. There is erythema.  Localized erythematous vesicular rash to from the mid  right thoracic paraspinal area to just underneath the right breast.   Nursing note and vitals reviewed.   ED Course  Procedures (including critical care time) Labs Review Labs Reviewed - No data to display  Imaging Review No results found.   EKG Interpretation None      MDM   Final diagnoses:  Herpes zoster   Pt is well appearing.  VSS.  Ambulates with her walker, gait steady.  Localized erythematous papular rash from right thoracic paraspinal area and extending just underneath the right breast.  No edema or drainage.   Pt appears stable for d/c and rx for valtrex given.  Pt agrees to close PMD f/u next week and ER return if needed    Kem Parkinson, PA-C 09/28/14 [place], MD 09/30/14 [phone]

## 2014-09-26 NOTE — ED Notes (Signed)
Seen and evaluated by t triplett pa

## 2015-12-08 DIAGNOSIS — M792 Neuralgia and neuritis, unspecified: Secondary | ICD-10-CM | POA: Diagnosis not present

## 2015-12-08 DIAGNOSIS — M159 Polyosteoarthritis, unspecified: Secondary | ICD-10-CM | POA: Diagnosis not present

## 2015-12-08 DIAGNOSIS — G20 Parkinson's disease: Secondary | ICD-10-CM | POA: Diagnosis not present

## 2015-12-08 DIAGNOSIS — G5600 Carpal tunnel syndrome, unspecified upper limb: Secondary | ICD-10-CM | POA: Diagnosis not present

## 2015-12-08 DIAGNOSIS — I10 Essential (primary) hypertension: Secondary | ICD-10-CM | POA: Diagnosis not present

## 2015-12-08 DIAGNOSIS — R269 Unspecified abnormalities of gait and mobility: Secondary | ICD-10-CM | POA: Diagnosis not present

## 2015-12-15 DIAGNOSIS — L851 Acquired keratosis [keratoderma] palmaris et plantaris: Secondary | ICD-10-CM | POA: Diagnosis not present

## 2015-12-15 DIAGNOSIS — I739 Peripheral vascular disease, unspecified: Secondary | ICD-10-CM | POA: Diagnosis not present

## 2015-12-15 DIAGNOSIS — B351 Tinea unguium: Secondary | ICD-10-CM | POA: Diagnosis not present

## 2016-01-07 DIAGNOSIS — B0229 Other postherpetic nervous system involvement: Secondary | ICD-10-CM | POA: Diagnosis not present

## 2016-01-07 DIAGNOSIS — I10 Essential (primary) hypertension: Secondary | ICD-10-CM | POA: Diagnosis not present

## 2016-01-07 DIAGNOSIS — R600 Localized edema: Secondary | ICD-10-CM | POA: Diagnosis not present

## 2016-01-07 DIAGNOSIS — Z23 Encounter for immunization: Secondary | ICD-10-CM | POA: Diagnosis not present

## 2016-01-07 DIAGNOSIS — G20 Parkinson's disease: Secondary | ICD-10-CM | POA: Diagnosis not present

## 2016-03-01 DIAGNOSIS — L97509 Non-pressure chronic ulcer of other part of unspecified foot with unspecified severity: Secondary | ICD-10-CM | POA: Diagnosis not present

## 2016-03-01 DIAGNOSIS — I739 Peripheral vascular disease, unspecified: Secondary | ICD-10-CM | POA: Diagnosis not present

## 2016-03-15 DIAGNOSIS — L97509 Non-pressure chronic ulcer of other part of unspecified foot with unspecified severity: Secondary | ICD-10-CM | POA: Diagnosis not present

## 2016-05-10 DIAGNOSIS — B351 Tinea unguium: Secondary | ICD-10-CM | POA: Diagnosis not present

## 2016-05-10 DIAGNOSIS — I739 Peripheral vascular disease, unspecified: Secondary | ICD-10-CM | POA: Diagnosis not present

## 2016-05-10 DIAGNOSIS — L851 Acquired keratosis [keratoderma] palmaris et plantaris: Secondary | ICD-10-CM | POA: Diagnosis not present

## 2016-06-06 DIAGNOSIS — G20 Parkinson's disease: Secondary | ICD-10-CM | POA: Diagnosis not present

## 2016-06-06 DIAGNOSIS — R269 Unspecified abnormalities of gait and mobility: Secondary | ICD-10-CM | POA: Diagnosis not present

## 2016-06-06 DIAGNOSIS — M159 Polyosteoarthritis, unspecified: Secondary | ICD-10-CM | POA: Diagnosis not present

## 2016-06-06 DIAGNOSIS — Z79899 Other long term (current) drug therapy: Secondary | ICD-10-CM | POA: Diagnosis not present

## 2016-07-19 DIAGNOSIS — I739 Peripheral vascular disease, unspecified: Secondary | ICD-10-CM | POA: Diagnosis not present

## 2016-07-19 DIAGNOSIS — L851 Acquired keratosis [keratoderma] palmaris et plantaris: Secondary | ICD-10-CM | POA: Diagnosis not present

## 2016-07-19 DIAGNOSIS — B351 Tinea unguium: Secondary | ICD-10-CM | POA: Diagnosis not present

## 2016-08-02 ENCOUNTER — Other Ambulatory Visit (HOSPITAL_COMMUNITY): Payer: Self-pay | Admitting: Pulmonary Disease

## 2016-08-02 ENCOUNTER — Ambulatory Visit (HOSPITAL_COMMUNITY)
Admission: RE | Admit: 2016-08-02 | Discharge: 2016-08-02 | Disposition: A | Discharge status: Home / Self Care | Payer: Medicare Other | Source: Ambulatory Visit | Attending: Pulmonary Disease | Admitting: Pulmonary Disease

## 2016-08-02 DIAGNOSIS — M79604 Pain in right leg: Secondary | ICD-10-CM

## 2016-08-02 DIAGNOSIS — M19071 Primary osteoarthritis, right ankle and foot: Secondary | ICD-10-CM | POA: Diagnosis not present

## 2016-08-02 DIAGNOSIS — S8991XA Unspecified injury of right lower leg, initial encounter: Secondary | ICD-10-CM | POA: Diagnosis not present

## 2016-08-02 DIAGNOSIS — G20 Parkinson's disease: Secondary | ICD-10-CM | POA: Diagnosis not present

## 2016-08-02 DIAGNOSIS — M1711 Unilateral primary osteoarthritis, right knee: Secondary | ICD-10-CM | POA: Diagnosis not present

## 2016-08-02 DIAGNOSIS — R600 Localized edema: Secondary | ICD-10-CM | POA: Diagnosis not present

## 2016-08-02 DIAGNOSIS — I10 Essential (primary) hypertension: Secondary | ICD-10-CM | POA: Diagnosis not present

## 2016-08-02 DIAGNOSIS — M85861 Other specified disorders of bone density and structure, right lower leg: Secondary | ICD-10-CM | POA: Insufficient documentation

## 2016-10-04 DIAGNOSIS — L851 Acquired keratosis [keratoderma] palmaris et plantaris: Secondary | ICD-10-CM | POA: Diagnosis not present

## 2016-10-04 DIAGNOSIS — I739 Peripheral vascular disease, unspecified: Secondary | ICD-10-CM | POA: Diagnosis not present

## 2016-10-04 DIAGNOSIS — B351 Tinea unguium: Secondary | ICD-10-CM | POA: Diagnosis not present

## 2016-11-28 DIAGNOSIS — G5600 Carpal tunnel syndrome, unspecified upper limb: Secondary | ICD-10-CM | POA: Diagnosis not present

## 2016-11-28 DIAGNOSIS — B0222 Postherpetic trigeminal neuralgia: Secondary | ICD-10-CM | POA: Diagnosis not present

## 2016-11-28 DIAGNOSIS — G20 Parkinson's disease: Secondary | ICD-10-CM | POA: Diagnosis not present

## 2016-11-28 DIAGNOSIS — M159 Polyosteoarthritis, unspecified: Secondary | ICD-10-CM | POA: Diagnosis not present

## 2016-12-13 ENCOUNTER — Other Ambulatory Visit (HOSPITAL_COMMUNITY): Payer: Self-pay | Admitting: Podiatry

## 2016-12-13 DIAGNOSIS — I739 Peripheral vascular disease, unspecified: Secondary | ICD-10-CM | POA: Diagnosis not present

## 2016-12-13 DIAGNOSIS — M7989 Other specified soft tissue disorders: Secondary | ICD-10-CM

## 2016-12-13 DIAGNOSIS — M79661 Pain in right lower leg: Secondary | ICD-10-CM

## 2016-12-13 DIAGNOSIS — B351 Tinea unguium: Secondary | ICD-10-CM | POA: Diagnosis not present

## 2016-12-13 DIAGNOSIS — L851 Acquired keratosis [keratoderma] palmaris et plantaris: Secondary | ICD-10-CM | POA: Diagnosis not present

## 2016-12-16 ENCOUNTER — Ambulatory Visit (HOSPITAL_COMMUNITY)
Admission: RE | Admit: 2016-12-16 | Discharge: 2016-12-16 | Disposition: A | Discharge status: Home / Self Care | Payer: Medicare Other | Source: Ambulatory Visit | Attending: Podiatry | Admitting: Podiatry

## 2016-12-16 DIAGNOSIS — R600 Localized edema: Secondary | ICD-10-CM | POA: Diagnosis not present

## 2016-12-16 DIAGNOSIS — M79661 Pain in right lower leg: Secondary | ICD-10-CM | POA: Diagnosis present

## 2016-12-16 DIAGNOSIS — M7989 Other specified soft tissue disorders: Secondary | ICD-10-CM | POA: Diagnosis present

## 2017-01-02 DIAGNOSIS — M25579 Pain in unspecified ankle and joints of unspecified foot: Secondary | ICD-10-CM | POA: Diagnosis not present

## 2017-01-02 DIAGNOSIS — R609 Edema, unspecified: Secondary | ICD-10-CM | POA: Diagnosis not present

## 2017-01-02 DIAGNOSIS — I10 Essential (primary) hypertension: Secondary | ICD-10-CM | POA: Diagnosis not present

## 2017-01-02 DIAGNOSIS — G20 Parkinson's disease: Secondary | ICD-10-CM | POA: Diagnosis not present

## 2017-02-01 DIAGNOSIS — I10 Essential (primary) hypertension: Secondary | ICD-10-CM | POA: Diagnosis not present

## 2017-02-01 DIAGNOSIS — I872 Venous insufficiency (chronic) (peripheral): Secondary | ICD-10-CM | POA: Diagnosis not present

## 2017-02-01 DIAGNOSIS — L600 Ingrowing nail: Secondary | ICD-10-CM | POA: Diagnosis not present

## 2017-02-01 DIAGNOSIS — G20 Parkinson's disease: Secondary | ICD-10-CM | POA: Diagnosis not present

## 2017-02-28 DIAGNOSIS — I739 Peripheral vascular disease, unspecified: Secondary | ICD-10-CM | POA: Diagnosis not present

## 2017-02-28 DIAGNOSIS — L851 Acquired keratosis [keratoderma] palmaris et plantaris: Secondary | ICD-10-CM | POA: Diagnosis not present

## 2017-02-28 DIAGNOSIS — B351 Tinea unguium: Secondary | ICD-10-CM | POA: Diagnosis not present

## 2017-02-28 DIAGNOSIS — L0390 Cellulitis, unspecified: Secondary | ICD-10-CM | POA: Diagnosis not present

## 2017-03-02 ENCOUNTER — Other Ambulatory Visit: Payer: Self-pay

## 2017-03-02 DIAGNOSIS — I872 Venous insufficiency (chronic) (peripheral): Secondary | ICD-10-CM

## 2017-04-19 ENCOUNTER — Encounter: Payer: Medicare Other | Admitting: Vascular Surgery

## 2017-04-19 ENCOUNTER — Encounter (HOSPITAL_COMMUNITY): Payer: Medicare Other

## 2017-05-09 DIAGNOSIS — K922 Gastrointestinal hemorrhage, unspecified: Secondary | ICD-10-CM | POA: Diagnosis not present

## 2017-05-09 DIAGNOSIS — I739 Peripheral vascular disease, unspecified: Secondary | ICD-10-CM | POA: Diagnosis not present

## 2017-05-09 DIAGNOSIS — B351 Tinea unguium: Secondary | ICD-10-CM | POA: Diagnosis not present

## 2017-05-09 DIAGNOSIS — L851 Acquired keratosis [keratoderma] palmaris et plantaris: Secondary | ICD-10-CM | POA: Diagnosis not present

## 2017-05-11 ENCOUNTER — Encounter: Payer: Self-pay | Admitting: General Surgery

## 2017-05-11 ENCOUNTER — Ambulatory Visit (INDEPENDENT_AMBULATORY_CARE_PROVIDER_SITE_OTHER): Payer: Medicare Other | Admitting: General Surgery

## 2017-05-11 VITALS — BP 145/70 | HR 63 | Temp 99.5°F | Resp 18 | Wt 122.0 lb

## 2017-05-11 DIAGNOSIS — L989 Disorder of the skin and subcutaneous tissue, unspecified: Secondary | ICD-10-CM

## 2017-05-11 NOTE — Patient Instructions (Addendum)
Leave area covered with a dressing. And change daily as needed.  Do not apply any ointment. Will get your schedule for an excision to determine what the lesion is and to remove it from the arm.

## 2017-05-12 NOTE — Patient Instructions (Signed)
[name]  05/12/2017     @PREFPERIOPPHARMACY @   Your procedure is scheduled on  05/17/2017  Report to [facility] at  700   A.M.  Call this number if you have problems the morning of surgery:  [phone]   Remember:  Do not eat food or drink liquids after midnight.  Take these medicines the morning of surgery with A SIP OF WATER  sinemet   Do not wear jewelry, make-up or nail polish.  Do not wear lotions, powders, or perfumes, or deodorant.  Do not shave 48 hours prior to surgery.  Men may shave face and neck.  Do not bring valuables to the hospital.  [facility] is not responsible for any belongings or valuables.  Contacts, dentures or bridgework may not be worn into surgery.  Leave your suitcase in the car.  After surgery it may be brought to your room.  For patients admitted to the hospital, discharge time will be determined by your treatment team.  Patients discharged the day of surgery will not be allowed to drive home.   Name and phone number of your driver:   family Special instructions:  None  Please read over the following fact sheets that you were given. Anesthesia Post-op Instructions and Care and Recovery After Surgery      Excision of Skin Lesions Excision of a skin lesion refers to the removal of a section of skin by making small cuts (incisions) in the skin. This procedure may be done to remove a cancerous (malignant) or noncancerous (benign) growth on the skin. It is typically done to treat or prevent cancer or infection. It may also be done to improve cosmetic appearance. The procedure may be done to remove:  Cancerous growths, such as basal cell carcinoma, squamous cell carcinoma, or melanoma.  Noncancerous growths, such as a cyst or lipoma.  Growths, such as moles or skin tags, which may be removed for cosmetic reasons.  Various excision or surgical techniques may be used depending on your condition, the location of the lesion,  and your overall health. Tell a health care provider about:  Any allergies you have.  All medicines you are taking, including vitamins, herbs, eye drops, creams, and over-the-counter medicines.  Any problems you or family members have had with anesthetic medicines.  Any blood disorders you have.  Any surgeries you have had.  Any medical conditions you have.  Whether you are pregnant or may be pregnant. What are the risks? Generally, this is a safe procedure. However, problems may occur, including:  Bleeding.  Infection.  Scarring.  Recurrence of the cyst, lipoma, or cancer.  Changes in skin sensation or appearance, such as discoloration or swelling.  Reaction to the anesthetics.  Allergic reaction to surgical materials or ointments.  Damage to nerves, blood vessels, muscles, or other structures.  Continued pain.  What happens before the procedure?  Ask your health care provider about: ? Changing or stopping your regular medicines. This is especially important if you are taking diabetes medicines or blood thinners. ? Taking medicines such as aspirin and ibuprofen. These medicines can thin your blood. Do not take these medicines before your procedure if your health care provider instructs you not to.  You may be asked to take certain medicines.  You may be asked to stop smoking.  You may have an exam or testing.  Plan to have someone take you home after the procedure.  Plan to have someone help you with activities during recovery. What happens during the procedure?  To reduce your risk of infection: ? Your health care team will wash or sanitize their hands. ? Your skin will be washed with soap.  You will be given a medicine to numb the area (local anesthetic).  One of the following excision techniques will be performed.  At the end of any of these procedures, antibiotic ointment will be applied as needed. Each of the following techniques may vary among  health care providers and hospitals. Complete Surgical Excision The area of skin that needs to be removed will be marked with a pen. Using a small scalpel or scissors, the surgeon will gently cut around and under the lesion until it is completely removed. The lesion will be placed in a fluid and sent to the lab for examination. If necessary, bleeding will be controlled with a device that delivers heat (electrocautery). The edges of the wound may be stitched (sutured) together, and a bandage (dressing) will be applied. This procedure may be performed to treat a cancerous growth or a noncancerous cyst or lesion. Excision of a Cyst The surgeon will make an incision on the cyst. The entire cyst will be removed through the incision. The incision may be closed with sutures. Shave Excision During shave excision, the surgeon will use a small blade or an electrically heated loop instrument to shave off the lesion. This may be done to remove a mole or a skin tag. The wound will usually be left to heal on its own without sutures. Punch Excision During punch excision, the surgeon will use a small tool that is like a cookie cutter or a hole punch to cut a circle shape out of the skin. The outer edges of the skin will be sutured together. This may be done to remove a mole or a scar or to perform a biopsy of the lesion. Mohs Micrographic Surgery During Mohs micrographic surgery, layers of the lesion will be removed with a scalpel or a loop instrument and will be examined right away under a microscope. Layers will be removed until all of the abnormal or cancerous tissue has been removed. This procedure is minimally invasive, and it ensures the best cosmetic outcome. It involves the removal of as little normal tissue as possible. Mohs is usually done to treat skin cancer, such as basal cell carcinoma or squamous cell carcinoma, particularly on the face and ears. Depending on the size of the surgical wound, it may be  sutured closed. What happens after the procedure?  Return to your normal activities as told by your health care provider.  Talk with your health care provider to discuss any test results, treatment options, and if necessary, the need for more tests. This information is not intended to replace advice given to you by your health care provider. Make sure you discuss any questions you have with your health care provider. Document Released: 06/29/2009 Document Revised: 09/10/2015 Document Reviewed: 05/21/2014 Elsevier Interactive Patient Education  2018 [employer]. Excision of Skin Lesions, Care After Refer to this sheet in the next few weeks. These instructions provide you with information about caring for yourself after your procedure. Your health care provider may also give you more specific instructions. Your treatment has been planned according to current medical practices, but problems sometimes occur. Call your health care provider if you have any problems or questions after your procedure. What can I expect after the procedure? After your procedure,  it is common to have pain or discomfort at the excision site. Follow these instructions at home:  Take over-the-counter and prescription medicines only as told by your health care provider.  Follow instructions from your health care provider about: ? How to take care of your excision site. You should keep the site clean, dry, and protected for at least 48 hours. ? When and how you should change your bandage (dressing). ? When you should remove your dressing. ? Removing whatever was used to close your excision site.  Check the excision area every day for signs of infection. Watch for: ? Redness, swelling, or pain. ? Fluid, blood, or pus.  For bleeding, apply gentle but firm pressure to the area using a folded towel for 20 minutes.  Avoid high-impact exercise and activities until the stitches (sutures) are removed or the area  heals.  Follow instructions from your health care provider about how to minimize scarring. Avoid sun exposure until the area has healed. Scarring should lessen over time.  Keep all follow-up visits as told by your health care provider. This is important. Contact a health care provider if:  You have a fever.  You have redness, swelling, or pain at the excision site.  You have fluid, blood, or pus coming from the excision site.  You have ongoing bleeding at the excision site.  You have pain that does not improve in 2-3 days after your procedure.  You notice skin irregularities or changes in sensation. This information is not intended to replace advice given to you by your health care provider. Make sure you discuss any questions you have with your health care provider. Document Released: 08/19/2014 Document Revised: 09/10/2015 Document Reviewed: 05/21/2014 Elsevier Interactive Patient Education  2018 [employer] Anesthesia is a term that refers to techniques, procedures, and medicines that help a person stay safe and comfortable during a medical procedure. Monitored anesthesia care, or sedation, is one type of anesthesia. Your anesthesia specialist may recommend sedation if you will be having a procedure that does not require you to be unconscious, such as:  Cataract surgery.  A dental procedure.  A biopsy.  A colonoscopy.  During the procedure, you may receive a medicine to help you relax (sedative). There are three levels of sedation:  Mild sedation. At this level, you may feel awake and relaxed. You will be able to follow directions.  Moderate sedation. At this level, you will be sleepy. You may not remember the procedure.  Deep sedation. At this level, you will be asleep. You will not remember the procedure.  The more medicine you are given, the deeper your level of sedation will be. Depending on how you respond to the procedure, the anesthesia  specialist may change your level of sedation or the type of anesthesia to fit your needs. An anesthesia specialist will monitor you closely during the procedure. Let your health care provider know about:  Any allergies you have.  All medicines you are taking, including vitamins, herbs, eye drops, creams, and over-the-counter medicines.  Any use of steroids (by mouth or as a cream).  Any problems you or family members have had with sedatives and anesthetic medicines.  Any blood disorders you have.  Any surgeries you have had.  Any medical conditions you have, such as sleep apnea.  Whether you are pregnant or may be pregnant.  Any use of cigarettes, alcohol, or street drugs. What are the risks? Generally, this is a safe procedure. However,  problems may occur, including:  Getting too much medicine (oversedation).  Nausea.  Allergic reaction to medicines.  Trouble breathing. If this happens, a breathing tube may be used to help with breathing. It will be removed when you are awake and breathing on your own.  Heart trouble.  Lung trouble.  Before the procedure Staying hydrated Follow instructions from your health care provider about hydration, which may include:  Up to 2 hours before the procedure - you may continue to drink clear liquids, such as water, clear fruit juice, black coffee, and plain tea.  Eating and drinking restrictions Follow instructions from your health care provider about eating and drinking, which may include:  8 hours before the procedure - stop eating heavy meals or foods such as meat, fried foods, or fatty foods.  6 hours before the procedure - stop eating light meals or foods, such as toast or cereal.  6 hours before the procedure - stop drinking milk or drinks that contain milk.  2 hours before the procedure - stop drinking clear liquids.  Medicines Ask your health care provider about:  Changing or stopping your regular medicines. This is  especially important if you are taking diabetes medicines or blood thinners.  Taking medicines such as aspirin and ibuprofen. These medicines can thin your blood. Do not take these medicines before your procedure if your health care provider instructs you not to.  Tests and exams  You will have a physical exam.  You may have blood tests done to show: ? How well your kidneys and liver are working. ? How well your blood can clot.  General instructions  Plan to have someone take you home from the hospital or clinic.  If you will be going home right after the procedure, plan to have someone with you for 24 hours.  What happens during the procedure?  Your blood pressure, heart rate, breathing, level of pain and overall condition will be monitored.  An IV tube will be inserted into one of your veins.  Your anesthesia specialist will give you medicines as needed to keep you comfortable during the procedure. This may mean changing the level of sedation.  The procedure will be performed. After the procedure  Your blood pressure, heart rate, breathing rate, and blood oxygen level will be monitored until the medicines you were given have worn off.  Do not drive for 24 hours if you received a sedative.  You may: ? Feel sleepy, clumsy, or nauseous. ? Feel forgetful about what happened after the procedure. ? Have a sore throat if you had a breathing tube during the procedure. ? Vomit. This information is not intended to replace advice given to you by your health care provider. Make sure you discuss any questions you have with your health care provider. Document Released: 12/29/2004 Document Revised: 09/11/2015 Document Reviewed: 07/26/2015 Elsevier Interactive Patient Education  2018 [place], Care After These instructions provide you with information about caring for yourself after your procedure. Your health care provider may also give you more specific  instructions. Your treatment has been planned according to current medical practices, but problems sometimes occur. Call your health care provider if you have any problems or questions after your procedure. What can I expect after the procedure? After your procedure, it is common to:  Feel sleepy for several hours.  Feel clumsy and have poor balance for several hours.  Feel forgetful about what happened after the procedure.  Have poor judgment for  several hours.  Feel nauseous or vomit.  Have a sore throat if you had a breathing tube during the procedure.  Follow these instructions at home: For at least 24 hours after the procedure:   Do not: ? Participate in activities in which you could fall or become injured. ? Drive. ? Use heavy machinery. ? Drink alcohol. ? Take sleeping pills or medicines that cause drowsiness. ? Make important decisions or sign legal documents. ? Take care of children on your own.  Rest. Eating and drinking  Follow the diet that is recommended by your health care provider.  If you vomit, drink water, juice, or soup when you can drink without vomiting.  Make sure you have little or no nausea before eating solid foods. General instructions  Have a responsible adult stay with you until you are awake and alert.  Take over-the-counter and prescription medicines only as told by your health care provider.  If you smoke, do not smoke without supervision.  Keep all follow-up visits as told by your health care provider. This is important. Contact a health care provider if:  You keep feeling nauseous or you keep vomiting.  You feel light-headed.  You develop a rash.  You have a fever. Get help right away if:  You have trouble breathing. This information is not intended to replace advice given to you by your health care provider. Make sure you discuss any questions you have with your health care provider. Document Released: 07/26/2015 Document  Revised: 11/25/2015 Document Reviewed: 07/26/2015 Elsevier Interactive Patient Education  [employer].

## 2017-05-12 NOTE — Progress Notes (Signed)
[facility] History and Physical  Reason for Referral: Granuloma on left arm  Referring Physician:  Dr. Caprice Beaver (Podiatry)   Chief Complaint    Mass      [name] is a 84 y.o. female.  HPI: [name] is a very pleasant 84 yo with a history of Parkinson's disease, HTN, and Arthritis, who has been to see Dr. Caprice Beaver for her foot care and reported new wound on her left arm. Dr. Caprice Beaver investigated and found an area concerning for a "granuloma" and sent her to be evaluated by general surgery.  The patient and her son, [name], state that they think they noticed the arm wound a few weeks ago. She thinks she hit the area, and then her watch rubbed against wound.  The area has come up in less than a few weeks per their report. She denies any other similar areas on the remaining part of her body.  She has a history of breast cancer and underwent a lumpectomy. She has had a colonoscopy in the past per her report.   Past Medical History:  Diagnosis Date  . Arthritis   . Hypertension   . Parkinson disease [facility])     Past Surgical History:  Procedure Laterality Date  . ABDOMINAL HYSTERECTOMY    . APPENDECTOMY    . BREAST SURGERY    . COMPRESSION HIP SCREW  04/10/2011   Procedure: COMPRESSION HIP;  Surgeon: Hessie Dibble, MD;  Location: [place];  Service: Orthopedics;  Laterality: Right;  . FRACTURE SURGERY      History reviewed. No pertinent family history.  Mom deceased at 11 Father deceased and unknown history   Social History   Tobacco Use  . Smoking status: Never Smoker  . Smokeless tobacco: Never Used  Substance Use Topics  . Alcohol use: No  . Drug use: No    Medications: I have reviewed the patient's current medications. Allergies as of 05/11/2017   No Known Allergies     Medication List        Accurate as of 05/11/17 11:59 PM. Always use your most recent med list.          carbidopa-levodopa 25-100 MG tablet Commonly known as:   SINEMET IR Take 1 tablet by mouth 4 (four) times daily.   cetirizine 10 MG tablet Commonly known as:  ZYRTEC Take 10 mg by mouth at bedtime.   gabapentin 100 MG capsule Commonly known as:  NEURONTIN Take 100 mg by mouth at bedtime.   ibuprofen 200 MG tablet Commonly known as:  ADVIL,MOTRIN Take 200 mg by mouth 4 (four) times daily. Arthritis.   torsemide 20 MG tablet Commonly known as:  DEMADEX Take 20 mg by mouth daily at 2 PM. In the afternoon.   Vitamin D3 2000 units Tabs Take 2,000 Units by mouth at bedtime. 50 mcg        ROS:  A comprehensive review of systems was negative except for: Musculoskeletal: positive for neck pain and stiff joints Neurological: positive for tremors and numbness, parkinson's disease Wound on left arm  Blood pressure (!) 145/70, pulse 63, temperature 99.5 F (37.5 C), resp. rate 18, weight 122 lb (55.3 kg). Physical Exam  Constitutional: She is oriented to person, place, and time. Vital signs are normal. She appears cachectic. No distress.  HENT:  Head: Normocephalic.  Neck contorted to the left  Eyes: Pupils are equal, round, and reactive to light.  Neck: Normal range of motion present.  Cardiovascular: Normal rate.  Pulmonary/Chest: Effort normal and breath sounds normal. Right breast exhibits no inverted nipple, no mass and no skin change. Left breast exhibits no inverted nipple, no mass and no skin change.  Abdominal: Soft. She exhibits no distension. There is no tenderness.  Musculoskeletal:  Left arm frail, thin skin, 2cm fungating mass from the dorsal surface, loose to skin, does not feel to be adherent to the deep structures  Lymphadenopathy:       Head (right side): No submental and no submandibular adenopathy present.       Head (left side): No submental and no submandibular adenopathy present.       Right cervical: No superficial cervical adenopathy present.      Left cervical: No superficial cervical adenopathy present.     She has no axillary adenopathy.  Neurological: She is alert and oriented to person, place, and time.  Skin: Skin is warm and dry.  Psychiatric: Mood, memory, affect and judgment normal.      Results: None  Assessment & Plan:  [name] is a 84 y.o. female with a left arm fungating mass that I am  Concerned is cutaneous metastasis. She has had a history of breast cancer in the past s/p lumpectomy, and on discussing the patient with Dr. Kathaleen Grinder she also has a history of concern for a renal cell cancer on prior imaging that the family opted to not pursue due to her advanced age.   -Excision of the mass under local anesthetic, will need to be able to use cautery due to the frail skin and prominent blood vessels around the area  -Will send to pathology and determine what the lesion consistent with  -Discussed with the patient and the son and Dr. Kathaleen Grinder my concern that this is a cutaneous metastasis, and that removing it will number one be more comfortable for the patient and number two give the diagnosis so that the patient and the family are aware and can make the appropriate plans moving forward   All questions were answered to the satisfaction of the patient and family.  The risk and benefits of excision of the mass were discussed including but not limited to bleeding, infection, risk that this will be a cancer.  After careful consideration, [name] has decided to proceed.    [name] 05/12/2017, 11:04 AM

## 2017-05-15 ENCOUNTER — Encounter (HOSPITAL_COMMUNITY): Payer: Self-pay

## 2017-05-15 ENCOUNTER — Other Ambulatory Visit: Payer: Self-pay

## 2017-05-15 ENCOUNTER — Encounter (HOSPITAL_COMMUNITY)
Admission: RE | Admit: 2017-05-15 | Discharge: 2017-05-15 | Disposition: A | Discharge status: Home / Self Care | Payer: Medicare Other | Source: Ambulatory Visit | Attending: General Surgery | Admitting: General Surgery

## 2017-05-15 DIAGNOSIS — C792 Secondary malignant neoplasm of skin: Secondary | ICD-10-CM | POA: Diagnosis not present

## 2017-05-15 DIAGNOSIS — I4510 Unspecified right bundle-branch block: Secondary | ICD-10-CM | POA: Diagnosis not present

## 2017-05-15 DIAGNOSIS — Z79899 Other long term (current) drug therapy: Secondary | ICD-10-CM | POA: Diagnosis not present

## 2017-05-15 DIAGNOSIS — Z853 Personal history of malignant neoplasm of breast: Secondary | ICD-10-CM | POA: Diagnosis not present

## 2017-05-15 DIAGNOSIS — M1990 Unspecified osteoarthritis, unspecified site: Secondary | ICD-10-CM | POA: Diagnosis not present

## 2017-05-15 DIAGNOSIS — I10 Essential (primary) hypertension: Secondary | ICD-10-CM | POA: Diagnosis not present

## 2017-05-15 DIAGNOSIS — G20 Parkinson's disease: Secondary | ICD-10-CM | POA: Diagnosis not present

## 2017-05-15 LAB — CBC WITH DIFFERENTIAL/PLATELET
BASOS PCT: 0 %
Basophils Absolute: 0 10*3/uL (ref 0.0–0.1)
EOS ABS: 0.2 10*3/uL (ref 0.0–0.7)
EOS PCT: 2 %
HCT: 42 % (ref 36.0–46.0)
HEMOGLOBIN: 12.9 g/dL (ref 12.0–15.0)
LYMPHS ABS: 3.5 10*3/uL (ref 0.7–4.0)
Lymphocytes Relative: 35 %
MCH: 29.1 pg (ref 26.0–34.0)
MCHC: 30.7 g/dL (ref 30.0–36.0)
MCV: 94.6 fL (ref 78.0–100.0)
MONO ABS: 0.7 10*3/uL (ref 0.1–1.0)
MONOS PCT: 7 %
NEUTROS PCT: 56 %
Neutro Abs: 5.5 10*3/uL (ref 1.7–7.7)
PLATELETS: 223 10*3/uL (ref 150–400)
RBC: 4.44 MIL/uL (ref 3.87–5.11)
RDW: 14.6 % (ref 11.5–15.5)
WBC: 10 10*3/uL (ref 4.0–10.5)

## 2017-05-15 LAB — COMPREHENSIVE METABOLIC PANEL
ALT: <5 U/L — ABNORMAL LOW (ref 14–54)
ANION GAP: 13 (ref 5–15)
AST: 19 U/L (ref 15–41)
Albumin: 3.9 g/dL (ref 3.5–5.0)
Alkaline Phosphatase: 79 U/L (ref 38–126)
BUN: 25 mg/dL — ABNORMAL HIGH (ref 6–20)
CALCIUM: 9.9 mg/dL (ref 8.9–10.3)
CO2: 29 mmol/L (ref 22–32)
Chloride: 102 mmol/L (ref 101–111)
Creatinine, Ser: 1.15 mg/dL — ABNORMAL HIGH (ref 0.44–1.00)
GFR calc non Af Amer: 38 mL/min — ABNORMAL LOW (ref >60)
GFR, EST AFRICAN AMERICAN: 44 mL/min — AB (ref >60)
GLUCOSE: 119 mg/dL — AB (ref 65–99)
POTASSIUM: 3.7 mmol/L (ref 3.5–5.1)
SODIUM: 144 mmol/L (ref 135–145)
Total Bilirubin: 1.2 mg/dL (ref 0.3–1.2)
Total Protein: 7.3 g/dL (ref 6.5–8.1)

## 2017-05-15 LAB — PROTIME-INR
INR: 0.91
Prothrombin Time: 12.2 seconds (ref 11.4–15.2)

## 2017-05-17 ENCOUNTER — Ambulatory Visit (HOSPITAL_COMMUNITY): Payer: Medicare Other | Admitting: Anesthesiology

## 2017-05-17 ENCOUNTER — Ambulatory Visit (HOSPITAL_COMMUNITY)
Admission: RE | Admit: 2017-05-17 | Discharge: 2017-05-17 | Disposition: A | Discharge status: Home / Self Care | Payer: Medicare Other | Source: Ambulatory Visit | Attending: General Surgery | Admitting: General Surgery

## 2017-05-17 ENCOUNTER — Encounter (HOSPITAL_COMMUNITY): Payer: Self-pay | Admitting: *Deleted

## 2017-05-17 ENCOUNTER — Encounter (HOSPITAL_COMMUNITY)
Admission: RE | Disposition: A | Discharge status: Home / Self Care | Payer: Self-pay | Source: Ambulatory Visit | Attending: General Surgery

## 2017-05-17 DIAGNOSIS — M1990 Unspecified osteoarthritis, unspecified site: Secondary | ICD-10-CM | POA: Insufficient documentation

## 2017-05-17 DIAGNOSIS — I4510 Unspecified right bundle-branch block: Secondary | ICD-10-CM | POA: Insufficient documentation

## 2017-05-17 DIAGNOSIS — C44629 Squamous cell carcinoma of skin of left upper limb, including shoulder: Secondary | ICD-10-CM | POA: Diagnosis not present

## 2017-05-17 DIAGNOSIS — Z853 Personal history of malignant neoplasm of breast: Secondary | ICD-10-CM | POA: Insufficient documentation

## 2017-05-17 DIAGNOSIS — L988 Other specified disorders of the skin and subcutaneous tissue: Secondary | ICD-10-CM | POA: Diagnosis not present

## 2017-05-17 DIAGNOSIS — Z79899 Other long term (current) drug therapy: Secondary | ICD-10-CM | POA: Insufficient documentation

## 2017-05-17 DIAGNOSIS — G20 Parkinson's disease: Secondary | ICD-10-CM | POA: Insufficient documentation

## 2017-05-17 DIAGNOSIS — C792 Secondary malignant neoplasm of skin: Secondary | ICD-10-CM | POA: Insufficient documentation

## 2017-05-17 DIAGNOSIS — I10 Essential (primary) hypertension: Secondary | ICD-10-CM | POA: Diagnosis not present

## 2017-05-17 DIAGNOSIS — L989 Disorder of the skin and subcutaneous tissue, unspecified: Secondary | ICD-10-CM

## 2017-05-17 HISTORY — PX: MASS EXCISION: SHX2000

## 2017-05-17 SURGERY — EXCISION MASS
Anesthesia: Monitor Anesthesia Care | Laterality: Left

## 2017-05-17 MED ORDER — KETAMINE HCL 10 MG/ML IJ SOLN
INTRAMUSCULAR | Status: AC
  Filled 2017-05-17: qty 1

## 2017-05-17 MED ORDER — LIDOCAINE HCL (PF) 1 % IJ SOLN
INTRAMUSCULAR | Status: DC | PRN
  Administered 2017-05-17: 6 mL

## 2017-05-17 MED ORDER — CHLORHEXIDINE GLUCONATE CLOTH 2 % EX PADS: 6.0000 | MEDICATED_PAD | Freq: Once | CUTANEOUS | Status: DC

## 2017-05-17 MED ORDER — LACTATED RINGERS IV SOLN: INTRAVENOUS | Status: DC | PRN

## 2017-05-17 MED ORDER — LIDOCAINE HCL (PF) 1 % IJ SOLN
INTRAMUSCULAR | Status: AC
Start: 2017-05-17 — End: ?
  Filled 2017-05-17: qty 30

## 2017-05-17 MED ORDER — LACTATED RINGERS IV SOLN
INTRAVENOUS | Status: DC
  Administered 2017-05-17: 08:00:00 via INTRAVENOUS

## 2017-05-17 MED ORDER — ARTIFICIAL TEARS OPHTHALMIC OINT
TOPICAL_OINTMENT | OPHTHALMIC | Status: AC
  Filled 2017-05-17: qty 3.5

## 2017-05-17 MED ORDER — TRAMADOL HCL 50 MG PO TABS: 25.0000 mg | ORAL_TABLET | Freq: Two times a day (BID) | ORAL | 0 refills | Status: DC | PRN

## 2017-05-17 MED ORDER — CEFAZOLIN SODIUM-DEXTROSE 2-4 GM/100ML-% IV SOLN
2.0000 g | INTRAVENOUS | Status: AC
  Administered 2017-05-17: 2 g via INTRAVENOUS

## 2017-05-17 MED ORDER — 0.9 % SODIUM CHLORIDE (POUR BTL) OPTIME
TOPICAL | Status: DC | PRN
  Administered 2017-05-17: 1000 mL

## 2017-05-17 MED ORDER — CEFAZOLIN SODIUM-DEXTROSE 2-4 GM/100ML-% IV SOLN
INTRAVENOUS | Status: AC
  Filled 2017-05-17: qty 100

## 2017-05-17 MED ORDER — KETAMINE HCL 10 MG/ML IJ SOLN
INTRAMUSCULAR | Status: DC | PRN
  Administered 2017-05-17: 10 mg via INTRAVENOUS

## 2017-05-17 SURGICAL SUPPLY — 31 items
BAG HAMPER (MISCELLANEOUS) ×2 IMPLANT
BANDAGE COBAN STERILE 2 (GAUZE/BANDAGES/DRESSINGS) ×1 IMPLANT
BNDG CONFORM 2 STRL LF (GAUZE/BANDAGES/DRESSINGS) ×1 IMPLANT
CLOTH BEACON ORANGE TIMEOUT ST (SAFETY) ×2 IMPLANT
COVER LIGHT HANDLE STERIS (MISCELLANEOUS) ×4 IMPLANT
DECANTER SPIKE VIAL GLASS SM (MISCELLANEOUS) ×2 IMPLANT
ELECT NDL TIP 2.8 STRL (NEEDLE) IMPLANT
ELECT NEEDLE TIP 2.8 STRL (NEEDLE) ×2 IMPLANT
ELECT REM PT RETURN 9FT ADLT (ELECTROSURGICAL) ×2
ELECTRODE REM PT RTRN 9FT ADLT (ELECTROSURGICAL) ×1 IMPLANT
GAUZE SPONGE 4X4 12PLY STRL (GAUZE/BANDAGES/DRESSINGS) ×4 IMPLANT
GLOVE BIO SURGEON STRL SZ 6.5 (GLOVE) ×2 IMPLANT
GLOVE BIOGEL PI IND STRL 6.5 (GLOVE) ×1 IMPLANT
GLOVE BIOGEL PI IND STRL 7.0 (GLOVE) ×1 IMPLANT
GLOVE BIOGEL PI INDICATOR 6.5 (GLOVE) ×2
GLOVE BIOGEL PI INDICATOR 7.0 (GLOVE) ×1
GLOVE SURG SS PI 6.5 STRL IVOR (GLOVE) ×1 IMPLANT
GOWN STRL REUS W/ TWL XL LVL3 (GOWN DISPOSABLE) ×1 IMPLANT
GOWN STRL REUS W/TWL LRG LVL3 (GOWN DISPOSABLE) ×2 IMPLANT
GOWN STRL REUS W/TWL XL LVL3 (GOWN DISPOSABLE) ×2
KIT ROOM TURNOVER APOR (KITS) ×2 IMPLANT
MANIFOLD NEPTUNE II (INSTRUMENTS) ×2 IMPLANT
NS IRRIG 1000ML POUR BTL (IV SOLUTION) ×2 IMPLANT
PACK BASIC LIMB (CUSTOM PROCEDURE TRAY) ×1 IMPLANT
PAD ARMBOARD 7.5X6 YLW CONV (MISCELLANEOUS) ×2 IMPLANT
SET BASIN LINEN APH (SET/KITS/TRAYS/PACK) ×2 IMPLANT
STRIP CLOSURE SKIN 1/4X3 (GAUZE/BANDAGES/DRESSINGS) ×1 IMPLANT
SUT MNCRL AB 4-0 PS2 18 (SUTURE) ×1 IMPLANT
SUT VIC AB 4-0 SH 27 (SUTURE) ×2
SUT VIC AB 4-0 SH 27XBRD (SUTURE) IMPLANT
SYR CONTROL 10ML LL (SYRINGE) ×2 IMPLANT

## 2017-05-17 NOTE — Transfer of Care (Signed)
Immediate Anesthesia Transfer of Care Note  Patient: [name]  Procedure(s) Performed: EXCISION LESION ON LEFT ARM (Left )  Patient Location: PACU  Anesthesia Type:MAC  Level of Consciousness: awake, alert , oriented and patient cooperative  Airway & Oxygen Therapy: Patient Spontanous Breathing  Post-op Assessment: Report given to RN and Post -op Vital signs reviewed and stable  Post vital signs: Reviewed and stable  Last Vitals:  Vitals:   05/17/17 0810 05/17/17 0815  BP: (!) 115/58 113/60  Resp: 17 19  Temp:    SpO2: 100% 100%    Last Pain:  Vitals:   05/17/17 0719  PainSc: 2       Patients Stated Pain Goal: 5 (99/35/70 1779)  Complications: No apparent anesthesia complications

## 2017-05-17 NOTE — Interval H&P Note (Signed)
History and Physical Interval Note:  05/17/2017 7:22 AM  [name]  has presented today for surgery, with the diagnosis of lesion left arm  The various methods of treatment have been discussed with the patient and family. After consideration of risks, benefits and other options for treatment, the patient has consented to  Procedure(s): EXCISION OF 2 CM LESION ON LEFT ARM (Left) as a surgical intervention .  The patient's history has been reviewed, patient examined, no change in status, stable for surgery.  I have reviewed the patient's chart and labs.  Questions were answered to the patient's satisfaction.    Arm marked. Discussed with anesthesia. No new complaints reported.   Virl Cagey

## 2017-05-17 NOTE — H&P (Signed)
[facility] History and Physical  Reason for Referral: Granuloma on left arm  Referring Physician:  Dr. Caprice Ewing (Podiatry)      Chief Complaint    Mass      [name] is a 84 y.o. female.  HPI: [name] is a very pleasant 84 yo with a history of Parkinson's disease, HTN, and Arthritis, who has been to see Dr. Caprice Ewing for her foot care and reported new wound on her left arm. Dr. Caprice Ewing investigated and found an area concerning for a "granuloma" and sent her to be evaluated by general surgery.  The patient and her son, [name], state that they think they noticed the arm wound a few weeks ago. She thinks she hit the area, and then her watch rubbed against wound.  The area has come up in less than a few weeks per their report. She denies any other similar areas on the remaining part of her body.  She has a history of breast cancer and underwent a lumpectomy. She has had a colonoscopy in the past per her report.       Past Medical History:  Diagnosis Date  . Arthritis   . Hypertension   . Parkinson disease [facility])          Past Surgical History:  Procedure Laterality Date  . ABDOMINAL HYSTERECTOMY    . APPENDECTOMY    . BREAST SURGERY    . COMPRESSION HIP SCREW  04/10/2011   Procedure: COMPRESSION HIP;  Surgeon: Hessie Dibble, MD;  Location: [facility];  Service: Orthopedics;  Laterality: Right;  . FRACTURE SURGERY      History reviewed. No pertinent family history.  Mom deceased at 26 Father deceased and unknown history   Social History       Tobacco Use  . Smoking status: Never Smoker  . Smokeless tobacco: Never Used  Substance Use Topics  . Alcohol use: No  . Drug use: No    Medications: I have reviewed the patient's current medications. Allergies as of 05/11/2017   No Known Allergies                 Medication List             Accurate as of 05/11/17 11:59 PM. Always use your most recent med  list.           carbidopa-levodopa 25-100 MG tablet Commonly known as:  SINEMET IR Take 1 tablet by mouth 4 (four) times daily.   cetirizine 10 MG tablet Commonly known as:  ZYRTEC Take 10 mg by mouth at bedtime.   gabapentin 100 MG capsule Commonly known as:  NEURONTIN Take 100 mg by mouth at bedtime.   ibuprofen 200 MG tablet Commonly known as:  ADVIL,MOTRIN Take 200 mg by mouth 4 (four) times daily. Arthritis.   torsemide 20 MG tablet Commonly known as:  DEMADEX Take 20 mg by mouth daily at 2 PM. In the afternoon.   Vitamin D3 2000 units Tabs Take 2,000 Units by mouth at bedtime. 50 mcg        ROS:  A comprehensive review of systems was negative except for: Musculoskeletal: positive for neck pain and stiff joints Neurological: positive for tremors and numbness, parkinson's disease Wound on left arm  Blood pressure (!) 145/70, pulse 63, temperature 99.5 F (37.5 C), resp. rate 18, weight 122 lb (55.3 kg). Physical Exam  Constitutional: She is oriented to person, place, and time. Vital signs are normal.  She appears cachectic. No distress.  HENT:  Head: Normocephalic.  Neck contorted to the left  Eyes: Pupils are equal, round, and reactive to light.  Neck: Normal range of motion present.  Cardiovascular: Normal rate.  Pulmonary/Chest: Effort normal and breath sounds normal. Right breast exhibits no inverted nipple, no mass and no skin change. Left breast exhibits no inverted nipple, no mass and no skin change.  Abdominal: Soft. She exhibits no distension. There is no tenderness.  Musculoskeletal:  Left arm frail, thin skin, 2cm fungating mass from the dorsal surface, loose to skin, does not feel to be adherent to the deep structures  Lymphadenopathy:       Head (right side): No submental and no submandibular adenopathy present.       Head (left side): No submental and no submandibular adenopathy present.       Right cervical: No superficial cervical  adenopathy present.      Left cervical: No superficial cervical adenopathy present.    She has no axillary adenopathy.  Neurological: She is alert and oriented to person, place, and time.  Skin: Skin is warm and dry.  Psychiatric: Mood, memory, affect and judgment normal.      Results: None  Assessment & Plan:  [name] is a 84 y.o. female with a left arm fungating mass that I am  Concerned is cutaneous metastasis. She has had a history of breast cancer in the past s/p lumpectomy, and on discussing the patient with Dr. Kathaleen Grinder she also has a history of concern for a renal cell cancer on prior imaging that the family opted to not pursue due to her advanced age.   -Excision of the mass under local anesthetic, will need to be able to use cautery due to the frail skin and prominent blood vessels around the area  -Will send to pathology and determine what the lesion consistent with  -Discussed with the patient and the son and Dr. Kathaleen Grinder my concern that this is a cutaneous metastasis, and that removing it will number one be more comfortable for the patient and number two give the diagnosis so that the patient and the family are aware and can make the appropriate plans moving forward   All questions were answered to the satisfaction of the patient and family.  The risk and benefits of excision of the mass were discussed including but not limited to bleeding, infection, risk that this will be a cancer.  After careful consideration, [name] has decided to proceed.    Virl Cagey 05/12/2017, 11:04 AM

## 2017-05-17 NOTE — Discharge Instructions (Signed)
General Anesthesia, Adult, Care After These instructions provide you with information about caring for yourself after your procedure. Your health care provider may also give you more specific instructions. Your treatment has been planned according to current medical practices, but problems sometimes occur. Call your health care provider if you have any problems or questions after your procedure. What can I expect after the procedure? After the procedure, it is common to have:  Vomiting.  A sore throat.  Mental slowness.  It is common to feel:  Nauseous.  Cold or shivery.  Sleepy.  Tired.  Sore or achy, even in parts of your body where you did not have surgery.  Follow these instructions at home: For at least 24 hours after the procedure:  Do not: ? Participate in activities where you could fall or become injured. ? Drive. ? Use heavy machinery. ? Drink alcohol. ? Take sleeping pills or medicines that cause drowsiness. ? Make important decisions or sign legal documents. ? Take care of children on your own.  Rest. Eating and drinking  If you vomit, drink water, juice, or soup when you can drink without vomiting.  Drink enough fluid to keep your urine clear or pale yellow.  Make sure you have little or no nausea before eating solid foods.  Follow the diet recommended by your health care provider. General instructions  Have a responsible adult stay with you until you are awake and alert.  Return to your normal activities as told by your health care provider. Ask your health care provider what activities are safe for you.  Take over-the-counter and prescription medicines only as told by your health care provider.  If you smoke, do not smoke without supervision.  Keep all follow-up visits as told by your health care provider. This is important. Contact a health care provider if:  You continue to have nausea or vomiting at home, and medicines are not helpful.  You  cannot drink fluids or start eating again.  You cannot urinate after 8-12 hours.  You develop a skin rash.  You have fever.  You have increasing redness at the site of your procedure. Get help right away if:  You have difficulty breathing.  You have chest pain.  You have unexpected bleeding.  You feel that you are having a life-threatening or urgent problem. This information is not intended to replace advice given to you by your health care provider. Make sure you discuss any questions you have with your health care provider. Document Released: 07/11/2000 Document Revised: 09/07/2015 Document Reviewed: 03/19/2015 Elsevier Interactive Patient Education  2018 [employer]. Discharge Instructions: Bathe per your regular routine but do not get your left arm wet at this time.   On Saturday 05/20/17, remove the dressing in place, and replace it with the same dressing (supplies provided).  Leave the little Steristrips in place, and replace the gauze, then a loose wrap of white gauze, and a wrap of the coband (beige material).   Leave this dressing in place until seeing me on Tuesday.  Expect to have some drainage and bruising at the site as this is not uncommon.  There will even likely be some redness around the incision. If the redness is spreading out into the arm or up the arm or down the arm, then please call. Take tylenol as needed for pain control every 4-6 hours.  DO NOT Take IBUPROFEN until Saturday 05/20/17.  You can start your Ibuprofen back at this time as long as there  is not a large amount of bleeding when the gauze is removed (small drainage/ dry blood normal).  Take Tramadol for breakthrough pain. Take colace for constipation related to narcotic pain medication. You can buy this at the drugstore.  Please call if you have any fevers, chills, or again if the redness around the incision starts to spread or worsen.

## 2017-05-17 NOTE — Anesthesia Postprocedure Evaluation (Signed)
Anesthesia Post Note  Patient: [name]  Procedure(s) Performed: EXCISION LESION ON LEFT ARM (Left )  Patient location during evaluation: PACU Anesthesia Type: MAC Level of consciousness: awake and alert, oriented and patient cooperative Pain management: pain level controlled Vital Signs Assessment: post-procedure vital signs reviewed and stable Respiratory status: spontaneous breathing, respiratory function stable and patient connected to nasal cannula oxygen Cardiovascular status: stable Postop Assessment: no apparent nausea or vomiting Anesthetic complications: no     Last Vitals:  Vitals:   05/17/17 0810 05/17/17 0815  BP: (!) 115/58 113/60  Resp: 17 19  Temp:    SpO2: 100% 100%    Last Pain:  Vitals:   05/17/17 0719  PainSc: 2                  ADAMS, AMY A

## 2017-05-17 NOTE — Op Note (Signed)
[facility] Operative Note  05/17/17  Preoperative Diagnosis: Lesion on left forearm concerning for cutaneous metastasis of unknown primary    Postoperative Diagnosis: Same   Procedure(s) Performed: Excision of skin lesion    Surgeon: Lanell Matar. Constance Haw, MD   Assistants: No qualified resident was available   Anesthesia: Monitored Anesthesia Care   Anesthesiologist: Mikey College, MD    Specimens:  Left arm lesion concerning for cutaneous metastasis, unknown primary, history of breast cancer and possible renall cell    Estimated Blood Loss: Minimal   Blood Replacement: None    Complications: None   Wound Class: Clean    Operative Indications:  Ms. Schnackenberg is a very pleasant 84 yo who lives alone and is otherwise healthy, but has noticed a lesion on her left arm since December. She thinks she hit it, and then her watch made things worse. The lesion has the appearance of a possible cutaneous metastasis, and after a discussion of the risk and benefits of removal with the patient and her son, Laverna Peace, including but not limited to bleeding, infection, possibility in difficulty healing, and possibility of a caner being diagnosed they opted to proceed.   Findings: Ulcerated fungated mass on left forearm 2cm in size    Procedure: The patient was taken to the operating room and placed supine with her left arm extended.  Monitored anesthesia care was performed.  Intravenous antibiotics were administered per protocol. All pressure points were padded. The left arm was prepped and draped in the usual sterile fashion.    The lesions and skin was examined and an elliptical border was drawn that allowed for closure of the skin without tension.  There was significant laxity of the skin, so this was possible in the longitudinal plane of the arm. Lidocaine 1% was infiltrated into the area and the lesion rose up off the plane as if a wheel formed under the lesion.  The skin immediately  started to become somewhat ecchymotic.  The incision was performed and carried down through the subcutaneous tissue with electrocautery.  The lesion did not invade the muscle but there was minimal subcutaneous fat remaining in the area after the excision due to the limited amount prior.    After control of bleeding with cautery and pressure, the elliptical incision was closed with a 4-0 Vicryl suture in an interrupted fashion the length of the incision.  The skin was very frail and was closed with a running 4-0 Monocryl suture, but the mid portion was too frail to adequately close this portion due to the skin pulling apart.  The mid portion was closed with 2 interrupted 4-0 Monocryl sutures placed on the outer portion of the incision.  The skin was very ecchymotic but this was not expanding.  It was like the skin was the epidermal layer of a split thickness skin graft and had lifted off the underlying plane. Given this, I did not want to preclude the skin from being able to adhere back to the tissue below, so small slits were created in the skin around the incision to allow for drainage during the imbibition phase of healing.  Steristrips were applied over the incision, so that it could drain if needed, and a pressure dressing with gauze and a coband was placed on the arm. There was no compromise in circulation and the radial pulse was 2+.     Final inspection revealed acceptable hemostasis. All counts were correct at the end of the case. The patient  was awakened from anesthesia without complication.  The patient went to the PACU in stable condition.   [name], MD [facility] [address] [place], [place] [address] [phone] (office)

## 2017-05-17 NOTE — Anesthesia Preprocedure Evaluation (Addendum)
Anesthesia Evaluation  Patient identified by MRN, date of birth, ID band Patient awake    Airway Mallampati: II  TM Distance: >3 FB Neck ROM: Full Positive for:  Tracheal deviation  Comment: Severe rotary distortion of neck/airway/trachea to the left.  Patient alert and oriented, no SOB, no distress, breathing comfortably Dental  (+) Edentulous Lower, Edentulous Upper   Pulmonary    breath sounds clear to auscultation       Cardiovascular Exercise Tolerance: Poor hypertension, Pt. on medications  Rhythm:Regular Rate:Normal  Normal sinus rhythm Incomplete right bundle branch block Moderate voltage criteria for LVH, may be normal variant ST & T wave abnormality, consider inferior ischemia ST & T wave abnormality, consider anterolateral ischemia  (Jan 2019)   Neuro/Psych Parkinson's Dz   GI/Hepatic   Endo/Other    Renal/GU Results for [name], [name] (MRN [id number]) as of 05/17/2017 07:15  05/15/2017 15:38 BUN: 25 (H) Creatinine: 1.15 (H)      Musculoskeletal  (+) Arthritis , Severe misalignment of cervical spine to the left with distortion of neck/trachea/airway   Abdominal   Peds  Hematology   Anesthesia Other Findings   Reproductive/Obstetrics                            Anesthesia Physical Anesthesia Plan  ASA: IV  Anesthesia Plan: MAC   Post-op Pain Management:    Induction:   PONV Risk Score and Plan:   Airway Management Planned: Nasal Cannula  Additional Equipment:   Intra-op Plan:   Post-operative Plan:   Informed Consent: I have reviewed the patients History and Physical, chart, labs and discussed the procedure including the risks, benefits and alternatives for the proposed anesthesia with the patient or authorized representative who has indicated his/her understanding and acceptance.   Dental advisory given  Plan Discussed with: CRNA and Surgeon  Anesthesia Plan  Comments:        Anesthesia Quick Evaluation

## 2017-05-18 ENCOUNTER — Telehealth: Payer: Self-pay | Admitting: General Surgery

## 2017-05-18 ENCOUNTER — Encounter (HOSPITAL_COMMUNITY): Payer: Self-pay | Admitting: General Surgery

## 2017-05-18 NOTE — Telephone Encounter (Signed)
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 Margins are negative.   Attempted to call Mr. [name], but his mailbox was full. Will try again.  [name], MD [facility] [address] [place], [place] [address] [phone] (office)

## 2017-05-18 NOTE — Telephone Encounter (Signed)
Tried to call Clair Gulling but no answer. Tried to call Ms. Bonnita Nasuti. Primary squamous cell of the skin. Dressing doing ok.   [name], MD [facility] [address] [place], [place] [address] [phone] (office)

## 2017-05-23 ENCOUNTER — Ambulatory Visit (INDEPENDENT_AMBULATORY_CARE_PROVIDER_SITE_OTHER): Payer: Self-pay | Admitting: General Surgery

## 2017-05-23 VITALS — BP 143/53 | HR 57 | Temp 98.2°F | Resp 18 | Ht 61.0 in | Wt 120.0 lb

## 2017-05-23 DIAGNOSIS — C44629 Squamous cell carcinoma of skin of left upper limb, including shoulder: Secondary | ICD-10-CM

## 2017-05-25 ENCOUNTER — Encounter: Payer: Self-pay | Admitting: General Surgery

## 2017-05-25 DIAGNOSIS — C44622 Squamous cell carcinoma of skin of right upper limb, including shoulder: Secondary | ICD-10-CM | POA: Insufficient documentation

## 2017-05-25 NOTE — Progress Notes (Signed)
[facility] Note   HPI:  84 y.o. Female presents to clinic for post-op follow-up evaluation after an excisional biopsy of a left arm lesion. The biopsy has come back as a primary skin cancer measuring 1.8cm and with negative margins. The patient's wound is doing well and the family redressed it as instructed.    Pathology: Diagnosis Skin , lower left arm - INVASIVE SQUAMOUS CELL CARCINOMA, 1.8 CM IN GREATEST DIMENSION. - MARGINS NOT INVOLVED. Microscopic Comment There is invasive squamous cell carcinoma and there is associated squamous cell carcinoma in situ and therefore the findings are consistent with primary cutaneous squamous cell carcinoma.  Review of Systems:  No fevers or chills No pain All other review of systems: otherwise negative   Vital Signs:  BP (!) 143/53   Pulse (!) 57   Temp 98.2 F (36.8 C)   Resp 18   Ht 5\' 1"  (1.549 m)   Wt 120 lb (54.4 kg)   BMI 22.67 kg/m    Physical Exam:  Physical Exam  Constitutional: She is well-developed, well-nourished, and in no distress.  HENT:  Head: Normocephalic.  Pulmonary/Chest: Effort normal.  Musculoskeletal:  Left forearm incision c/d/i with steristrips, small monocryl suture sticking out, trimmed, minimal ecchymosis, skin looks viable and no necrosis    Laboratory studies: None   Imaging:  None    Assessment:  84 y.o. 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 The wound is healing nicely at this time.   Plan:  - Discussed with the patient and son that this is the best possible finding given the concern that more could have been going on like a cutaneous metastasis.  - She has no clinically evident lymphadenopathy and does not need any lymphadenectomy or SLNB given that it is squamous cell but also given that she is older and chemotherapy is not desired   - Follow up PRN and call with concerns for the wound  All of the above  recommendations were discussed with the patient and patient's family, and all of patient's and family's questions were answered to their expressed satisfaction.  [name], MD [facility] [address] [place], [place] [address] [phone] (office)

## 2017-05-30 DIAGNOSIS — G20 Parkinson's disease: Secondary | ICD-10-CM | POA: Diagnosis not present

## 2017-05-30 DIAGNOSIS — R2689 Other abnormalities of gait and mobility: Secondary | ICD-10-CM | POA: Diagnosis not present

## 2017-05-30 DIAGNOSIS — Z79899 Other long term (current) drug therapy: Secondary | ICD-10-CM | POA: Diagnosis not present

## 2017-05-30 DIAGNOSIS — M130 Polyarthritis, unspecified: Secondary | ICD-10-CM | POA: Diagnosis not present

## 2017-06-20 ENCOUNTER — Ambulatory Visit: Payer: Medicare Other | Admitting: General Surgery

## 2017-07-18 DIAGNOSIS — I739 Peripheral vascular disease, unspecified: Secondary | ICD-10-CM | POA: Diagnosis not present

## 2017-07-18 DIAGNOSIS — L851 Acquired keratosis [keratoderma] palmaris et plantaris: Secondary | ICD-10-CM | POA: Diagnosis not present

## 2017-07-18 DIAGNOSIS — B351 Tinea unguium: Secondary | ICD-10-CM | POA: Diagnosis not present

## 2017-08-02 DIAGNOSIS — B0229 Other postherpetic nervous system involvement: Secondary | ICD-10-CM | POA: Diagnosis not present

## 2017-08-02 DIAGNOSIS — R42 Dizziness and giddiness: Secondary | ICD-10-CM | POA: Diagnosis not present

## 2017-08-02 DIAGNOSIS — I872 Venous insufficiency (chronic) (peripheral): Secondary | ICD-10-CM | POA: Diagnosis not present

## 2017-08-02 DIAGNOSIS — G20 Parkinson's disease: Secondary | ICD-10-CM | POA: Diagnosis not present

## 2017-09-26 DIAGNOSIS — L851 Acquired keratosis [keratoderma] palmaris et plantaris: Secondary | ICD-10-CM | POA: Diagnosis not present

## 2017-09-26 DIAGNOSIS — B351 Tinea unguium: Secondary | ICD-10-CM | POA: Diagnosis not present

## 2017-09-26 DIAGNOSIS — I739 Peripheral vascular disease, unspecified: Secondary | ICD-10-CM | POA: Diagnosis not present

## 2017-11-21 DIAGNOSIS — G20 Parkinson's disease: Secondary | ICD-10-CM | POA: Diagnosis not present

## 2017-11-21 DIAGNOSIS — R2689 Other abnormalities of gait and mobility: Secondary | ICD-10-CM | POA: Diagnosis not present

## 2017-11-21 DIAGNOSIS — M130 Polyarthritis, unspecified: Secondary | ICD-10-CM | POA: Diagnosis not present

## 2017-11-21 DIAGNOSIS — Z79899 Other long term (current) drug therapy: Secondary | ICD-10-CM | POA: Diagnosis not present

## 2017-12-05 DIAGNOSIS — I739 Peripheral vascular disease, unspecified: Secondary | ICD-10-CM | POA: Diagnosis not present

## 2017-12-05 DIAGNOSIS — L851 Acquired keratosis [keratoderma] palmaris et plantaris: Secondary | ICD-10-CM | POA: Diagnosis not present

## 2017-12-05 DIAGNOSIS — B351 Tinea unguium: Secondary | ICD-10-CM | POA: Diagnosis not present

## 2018-02-01 DIAGNOSIS — R21 Rash and other nonspecific skin eruption: Secondary | ICD-10-CM | POA: Diagnosis not present

## 2018-02-01 DIAGNOSIS — I10 Essential (primary) hypertension: Secondary | ICD-10-CM | POA: Diagnosis not present

## 2018-02-01 DIAGNOSIS — G20 Parkinson's disease: Secondary | ICD-10-CM | POA: Diagnosis not present

## 2018-02-01 DIAGNOSIS — R634 Abnormal weight loss: Secondary | ICD-10-CM | POA: Diagnosis not present

## 2018-03-06 ENCOUNTER — Other Ambulatory Visit (HOSPITAL_COMMUNITY)
Admission: RE | Admit: 2018-03-06 | Discharge: 2018-03-06 | Disposition: A | Discharge status: Home / Self Care | Payer: Medicare Other | Source: Ambulatory Visit | Attending: Podiatry | Admitting: Podiatry

## 2018-03-06 ENCOUNTER — Other Ambulatory Visit (HOSPITAL_COMMUNITY): Payer: Self-pay | Admitting: Podiatry

## 2018-03-06 ENCOUNTER — Ambulatory Visit (HOSPITAL_COMMUNITY)
Admission: RE | Admit: 2018-03-06 | Discharge: 2018-03-06 | Disposition: A | Discharge status: Home / Self Care | Payer: Medicare Other | Source: Ambulatory Visit | Attending: Podiatry | Admitting: Podiatry

## 2018-03-06 DIAGNOSIS — L97529 Non-pressure chronic ulcer of other part of left foot with unspecified severity: Secondary | ICD-10-CM | POA: Insufficient documentation

## 2018-03-06 DIAGNOSIS — M2012 Hallux valgus (acquired), left foot: Secondary | ICD-10-CM | POA: Diagnosis not present

## 2018-03-06 DIAGNOSIS — M869 Osteomyelitis, unspecified: Secondary | ICD-10-CM | POA: Diagnosis not present

## 2018-03-06 DIAGNOSIS — L03032 Cellulitis of left toe: Secondary | ICD-10-CM | POA: Insufficient documentation

## 2018-03-06 DIAGNOSIS — B351 Tinea unguium: Secondary | ICD-10-CM | POA: Diagnosis not present

## 2018-03-06 DIAGNOSIS — M109 Gout, unspecified: Secondary | ICD-10-CM | POA: Diagnosis not present

## 2018-03-06 DIAGNOSIS — M79672 Pain in left foot: Secondary | ICD-10-CM | POA: Diagnosis not present

## 2018-03-06 DIAGNOSIS — M19072 Primary osteoarthritis, left ankle and foot: Secondary | ICD-10-CM | POA: Insufficient documentation

## 2018-03-06 LAB — CBC WITH DIFFERENTIAL/PLATELET
ABS IMMATURE GRANULOCYTES: 0.05 10*3/uL (ref 0.00–0.07)
Basophils Absolute: 0 10*3/uL (ref 0.0–0.1)
Basophils Relative: 0 %
Eosinophils Absolute: 0.2 10*3/uL (ref 0.0–0.5)
Eosinophils Relative: 2 %
HCT: 40 % (ref 36.0–46.0)
HEMOGLOBIN: 12.3 g/dL (ref 12.0–15.0)
Immature Granulocytes: 1 %
LYMPHS PCT: 31 %
Lymphs Abs: 2.8 10*3/uL (ref 0.7–4.0)
MCH: 29.3 pg (ref 26.0–34.0)
MCHC: 30.8 g/dL (ref 30.0–36.0)
MCV: 95.2 fL (ref 80.0–100.0)
MONO ABS: 0.6 10*3/uL (ref 0.1–1.0)
MONOS PCT: 7 %
Neutro Abs: 5.4 10*3/uL (ref 1.7–7.7)
Neutrophils Relative %: 59 %
Platelets: 248 10*3/uL (ref 150–400)
RBC: 4.2 MIL/uL (ref 3.87–5.11)
RDW: 14.7 % (ref 11.5–15.5)
WBC: 9 10*3/uL (ref 4.0–10.5)
nRBC: 0 % (ref 0.0–0.2)

## 2018-03-06 LAB — BASIC METABOLIC PANEL
Anion gap: 11 (ref 5–15)
BUN: 25 mg/dL — AB (ref 8–23)
CHLORIDE: 99 mmol/L (ref 98–111)
CO2: 32 mmol/L (ref 22–32)
Calcium: 9.6 mg/dL (ref 8.9–10.3)
Creatinine, Ser: 1.14 mg/dL — ABNORMAL HIGH (ref 0.44–1.00)
GFR calc Af Amer: 45 mL/min — ABNORMAL LOW (ref >60)
GFR calc non Af Amer: 38 mL/min — ABNORMAL LOW (ref >60)
GLUCOSE: 100 mg/dL — AB (ref 70–99)
POTASSIUM: 3 mmol/L — AB (ref 3.5–5.1)
Sodium: 142 mmol/L (ref 135–145)

## 2018-03-06 LAB — SEDIMENTATION RATE: Sed Rate: 42 mm/hr — ABNORMAL HIGH (ref 0–22)

## 2018-03-06 LAB — URIC ACID: Uric Acid, Serum: 8.7 mg/dL — ABNORMAL HIGH (ref 2.5–7.1)

## 2018-03-13 DIAGNOSIS — L97529 Non-pressure chronic ulcer of other part of left foot with unspecified severity: Secondary | ICD-10-CM | POA: Diagnosis not present

## 2018-03-13 DIAGNOSIS — M109 Gout, unspecified: Secondary | ICD-10-CM | POA: Diagnosis not present

## 2018-04-03 DIAGNOSIS — L97522 Non-pressure chronic ulcer of other part of left foot with fat layer exposed: Secondary | ICD-10-CM | POA: Diagnosis not present

## 2018-04-10 DIAGNOSIS — R32 Unspecified urinary incontinence: Secondary | ICD-10-CM | POA: Diagnosis not present

## 2018-04-10 DIAGNOSIS — M1990 Unspecified osteoarthritis, unspecified site: Secondary | ICD-10-CM | POA: Diagnosis not present

## 2018-05-15 DIAGNOSIS — I739 Peripheral vascular disease, unspecified: Secondary | ICD-10-CM | POA: Diagnosis not present

## 2018-05-15 DIAGNOSIS — B351 Tinea unguium: Secondary | ICD-10-CM | POA: Diagnosis not present

## 2018-05-15 DIAGNOSIS — L851 Acquired keratosis [keratoderma] palmaris et plantaris: Secondary | ICD-10-CM | POA: Diagnosis not present

## 2018-05-15 DIAGNOSIS — L97522 Non-pressure chronic ulcer of other part of left foot with fat layer exposed: Secondary | ICD-10-CM | POA: Diagnosis not present

## 2018-05-30 DIAGNOSIS — R2689 Other abnormalities of gait and mobility: Secondary | ICD-10-CM | POA: Diagnosis not present

## 2018-05-30 DIAGNOSIS — M130 Polyarthritis, unspecified: Secondary | ICD-10-CM | POA: Diagnosis not present

## 2018-05-30 DIAGNOSIS — Z79899 Other long term (current) drug therapy: Secondary | ICD-10-CM | POA: Diagnosis not present

## 2018-05-30 DIAGNOSIS — G20 Parkinson's disease: Secondary | ICD-10-CM | POA: Diagnosis not present

## 2018-06-18 DIAGNOSIS — Z23 Encounter for immunization: Secondary | ICD-10-CM | POA: Diagnosis not present

## 2018-06-23 ENCOUNTER — Encounter (HOSPITAL_COMMUNITY): Payer: Self-pay | Admitting: *Deleted

## 2018-06-23 ENCOUNTER — Emergency Department (HOSPITAL_COMMUNITY): Payer: Medicare Other

## 2018-06-23 ENCOUNTER — Inpatient Hospital Stay (HOSPITAL_COMMUNITY)
Admission: EM | Admit: 2018-06-23 | Discharge: 2018-06-27 | DRG: 682 | Disposition: A | Discharge status: Skilled Nursing Facility | Payer: Medicare Other | Attending: Pulmonary Disease | Admitting: Pulmonary Disease

## 2018-06-23 ENCOUNTER — Other Ambulatory Visit: Payer: Self-pay

## 2018-06-23 DIAGNOSIS — M6281 Muscle weakness (generalized): Secondary | ICD-10-CM | POA: Diagnosis not present

## 2018-06-23 DIAGNOSIS — R296 Repeated falls: Secondary | ICD-10-CM | POA: Diagnosis present

## 2018-06-23 DIAGNOSIS — R531 Weakness: Secondary | ICD-10-CM | POA: Diagnosis not present

## 2018-06-23 DIAGNOSIS — G20 Parkinson's disease: Secondary | ICD-10-CM | POA: Diagnosis not present

## 2018-06-23 DIAGNOSIS — Z7401 Bed confinement status: Secondary | ICD-10-CM | POA: Diagnosis not present

## 2018-06-23 DIAGNOSIS — N183 Chronic kidney disease, stage 3 unspecified: Secondary | ICD-10-CM | POA: Diagnosis present

## 2018-06-23 DIAGNOSIS — E860 Dehydration: Secondary | ICD-10-CM | POA: Diagnosis present

## 2018-06-23 DIAGNOSIS — T148XXA Other injury of unspecified body region, initial encounter: Secondary | ICD-10-CM | POA: Diagnosis not present

## 2018-06-23 DIAGNOSIS — S0083XA Contusion of other part of head, initial encounter: Secondary | ICD-10-CM | POA: Diagnosis not present

## 2018-06-23 DIAGNOSIS — S299XXA Unspecified injury of thorax, initial encounter: Secondary | ICD-10-CM | POA: Diagnosis not present

## 2018-06-23 DIAGNOSIS — R262 Difficulty in walking, not elsewhere classified: Secondary | ICD-10-CM | POA: Diagnosis not present

## 2018-06-23 DIAGNOSIS — R0781 Pleurodynia: Secondary | ICD-10-CM | POA: Diagnosis not present

## 2018-06-23 DIAGNOSIS — N179 Acute kidney failure, unspecified: Secondary | ICD-10-CM | POA: Diagnosis not present

## 2018-06-23 DIAGNOSIS — R627 Adult failure to thrive: Secondary | ICD-10-CM | POA: Diagnosis not present

## 2018-06-23 DIAGNOSIS — S79911A Unspecified injury of right hip, initial encounter: Secondary | ICD-10-CM | POA: Diagnosis not present

## 2018-06-23 DIAGNOSIS — Z6820 Body mass index (BMI) 20.0-20.9, adult: Secondary | ICD-10-CM

## 2018-06-23 DIAGNOSIS — I129 Hypertensive chronic kidney disease with stage 1 through stage 4 chronic kidney disease, or unspecified chronic kidney disease: Secondary | ICD-10-CM | POA: Diagnosis present

## 2018-06-23 DIAGNOSIS — S3993XA Unspecified injury of pelvis, initial encounter: Secondary | ICD-10-CM | POA: Diagnosis not present

## 2018-06-23 DIAGNOSIS — R55 Syncope and collapse: Secondary | ICD-10-CM | POA: Diagnosis not present

## 2018-06-23 DIAGNOSIS — R41841 Cognitive communication deficit: Secondary | ICD-10-CM | POA: Diagnosis not present

## 2018-06-23 DIAGNOSIS — Y92009 Unspecified place in unspecified non-institutional (private) residence as the place of occurrence of the external cause: Secondary | ICD-10-CM | POA: Diagnosis not present

## 2018-06-23 DIAGNOSIS — G9341 Metabolic encephalopathy: Secondary | ICD-10-CM | POA: Diagnosis present

## 2018-06-23 DIAGNOSIS — I10 Essential (primary) hypertension: Secondary | ICD-10-CM | POA: Diagnosis present

## 2018-06-23 DIAGNOSIS — R5381 Other malaise: Secondary | ICD-10-CM | POA: Diagnosis present

## 2018-06-23 DIAGNOSIS — W19XXXA Unspecified fall, initial encounter: Secondary | ICD-10-CM | POA: Diagnosis present

## 2018-06-23 DIAGNOSIS — T07XXXA Unspecified multiple injuries, initial encounter: Secondary | ICD-10-CM

## 2018-06-23 DIAGNOSIS — Z9181 History of falling: Secondary | ICD-10-CM | POA: Diagnosis not present

## 2018-06-23 DIAGNOSIS — Z66 Do not resuscitate: Secondary | ICD-10-CM | POA: Diagnosis present

## 2018-06-23 DIAGNOSIS — M25551 Pain in right hip: Secondary | ICD-10-CM | POA: Diagnosis not present

## 2018-06-23 DIAGNOSIS — R52 Pain, unspecified: Secondary | ICD-10-CM

## 2018-06-23 DIAGNOSIS — R1310 Dysphagia, unspecified: Secondary | ICD-10-CM | POA: Diagnosis not present

## 2018-06-23 DIAGNOSIS — E876 Hypokalemia: Secondary | ICD-10-CM | POA: Diagnosis not present

## 2018-06-23 DIAGNOSIS — R278 Other lack of coordination: Secondary | ICD-10-CM | POA: Diagnosis not present

## 2018-06-23 DIAGNOSIS — G20A1 Parkinson's disease without dyskinesia, without mention of fluctuations: Secondary | ICD-10-CM | POA: Diagnosis present

## 2018-06-23 LAB — URINALYSIS, ROUTINE W REFLEX MICROSCOPIC
BILIRUBIN URINE: NEGATIVE
Glucose, UA: NEGATIVE mg/dL
Hgb urine dipstick: NEGATIVE
Ketones, ur: NEGATIVE mg/dL
Leukocytes,Ua: NEGATIVE
NITRITE: NEGATIVE
PROTEIN: NEGATIVE mg/dL
SPECIFIC GRAVITY, URINE: 1.014 (ref 1.005–1.030)
pH: 5 (ref 5.0–8.0)

## 2018-06-23 MED ORDER — CARBIDOPA-LEVODOPA 25-100 MG PO TABS
1.0000 | ORAL_TABLET | Freq: Four times a day (QID) | ORAL | Status: DC
  Administered 2018-06-24 – 2018-06-27 (×12): 1 via ORAL
  Filled 2018-06-23 (×19): qty 1

## 2018-06-23 MED ORDER — TRAMADOL HCL 50 MG PO TABS
25.0000 mg | ORAL_TABLET | Freq: Two times a day (BID) | ORAL | Status: DC | PRN
  Administered 2018-06-25 – 2018-06-26 (×2): 50 mg via ORAL
  Filled 2018-06-23 (×2): qty 1

## 2018-06-23 MED ORDER — VITAMIN D 25 MCG (1000 UNIT) PO TABS
2000.0000 [IU] | ORAL_TABLET | Freq: Every day | ORAL | Status: DC
  Administered 2018-06-24 – 2018-06-26 (×4): 2000 [IU] via ORAL
  Filled 2018-06-23 (×5): qty 2

## 2018-06-23 MED ORDER — LORATADINE 10 MG PO TABS
10.0000 mg | ORAL_TABLET | Freq: Every day | ORAL | Status: DC
  Administered 2018-06-25 – 2018-06-27 (×3): 10 mg via ORAL
  Filled 2018-06-23 (×3): qty 1

## 2018-06-23 MED ORDER — TORSEMIDE 20 MG PO TABS
20.0000 mg | ORAL_TABLET | Freq: Every day | ORAL | Status: DC
  Administered 2018-06-24: 20 mg via ORAL
  Filled 2018-06-23 (×4): qty 1

## 2018-06-23 MED ORDER — GABAPENTIN 100 MG PO CAPS
100.0000 mg | ORAL_CAPSULE | Freq: Every day | ORAL | Status: DC
  Administered 2018-06-24 – 2018-06-26 (×4): 100 mg via ORAL
  Filled 2018-06-23 (×4): qty 1

## 2018-06-23 NOTE — ED Notes (Signed)
Pt able to stand and pivot to [facility] with assist.

## 2018-06-23 NOTE — ED Notes (Signed)
ED Provider at bedside. 

## 2018-06-23 NOTE — ED Provider Notes (Signed)
[facility] EMERGENCY DEPARTMENT Provider Note   CSN: [id number] Arrival date & time: 06/23/18  2057    History   Chief Complaint Chief Complaint  Patient presents with  . Fall    HPI [name] is a 85 y.o. female.     HPI   She presents for injuries from fall at home tonight.  She injured her right hip and right chest, when she fell.  She is not sure why she fell.  With family members and presents by EMS for evaluation.  She is due to be seen a nursing home, in 2 days time.  She has apparently fallen other times at home, recently.  There are no other known modifying factors.   Past Medical History:  Diagnosis Date  . Arthritis   . Hypertension   . Parkinson disease [facility])     Patient Active Problem List   Diagnosis Date Noted  . Squamous cell cancer of skin of right forearm 05/25/2017  . Hip fracture ([place]) 04/14/2011  . Hypertension   . Parkinson disease [facility])     Past Surgical History:  Procedure Laterality Date  . ABDOMINAL HYSTERECTOMY    . APPENDECTOMY    . BREAST SURGERY Left    lumpectomy  . COMPRESSION HIP SCREW  04/10/2011   Procedure: COMPRESSION HIP;  Surgeon: Hessie Dibble, MD;  Location: [facility];  Service: Orthopedics;  Laterality: Right;  . FRACTURE SURGERY    . MASS EXCISION Left 05/17/2017   Procedure: EXCISION LESION ON LEFT ARM;  Surgeon: Virl Cagey, MD;  Location: [facility];  Service: General;  Laterality: Left;     OB History   No obstetric history on file.      Home Medications    Prior to Admission medications   Medication Sig Start Date End Date Taking? Authorizing Provider  carbidopa-levodopa (SINEMET) 25-100 MG per tablet Take 1 tablet by mouth 4 (four) times daily.      [provider]  cetirizine (ZYRTEC) 10 MG tablet Take 10 mg by mouth at bedtime.     [provider]  Cholecalciferol (VITAMIN D3) 2000 units TABS Take 2,000 Units by mouth at bedtime. 50 mcg    [provider]  gabapentin  (NEURONTIN) 100 MG capsule Take 100 mg by mouth at bedtime. 04/19/17   [provider]  torsemide (DEMADEX) 20 MG tablet Take 20 mg by mouth daily at 2 PM. In the afternoon. 05/03/17   [provider]  traMADol (ULTRAM) 50 MG tablet Take 0.5-1 tablets (25-50 mg total) by mouth every 12 (twelve) hours as needed for severe pain. 05/17/17   Virl Cagey, MD    Family History No family history on file.  Social History Social History   Tobacco Use  . Smoking status: Never Smoker  . Smokeless tobacco: Never Used  Substance Use Topics  . Alcohol use: No  . Drug use: No     Allergies   Patient has no known allergies.   Review of Systems Review of Systems  All other systems reviewed and are negative.    Physical Exam Updated Vital Signs BP (!) 107/57   Pulse (!) 59   Temp 97.8 F (36.6 C) (Oral)   Resp 16   Ht 4\' 10"  (1.473 m)   Wt 45.4 kg   SpO2 97%   BMI 20.90 kg/m   Physical Exam Vitals signs and nursing note reviewed.  Constitutional:      General: She is not  in acute distress.    Appearance: Normal appearance. She is well-developed. She is not ill-appearing, toxic-appearing or diaphoretic.  HENT:     Head: Normocephalic and atraumatic.     Right Ear: External ear normal.     Left Ear: External ear normal.     Mouth/Throat:     Pharynx: No oropharyngeal exudate.  Eyes:     Conjunctiva/sclera: Conjunctivae normal.     Pupils: Pupils are equal, round, and reactive to light.  Neck:     Musculoskeletal: Normal range of motion and neck supple.     Trachea: Phonation normal.  Cardiovascular:     Rate and Rhythm: Normal rate and regular rhythm.     Heart sounds: Normal heart sounds.  Pulmonary:     Effort: Pulmonary effort is normal.     Breath sounds: Normal breath sounds.  Chest:     Chest wall: Tenderness (Right chest wall without deformity or crepitation) present.  Abdominal:     Palpations: Abdomen is soft.     Tenderness: There is no  abdominal tenderness.  Musculoskeletal: Normal range of motion.        General: No swelling or tenderness.  Skin:    General: Skin is warm and dry.  Neurological:     Mental Status: She is alert and oriented to person, place, and time.     Cranial Nerves: No cranial nerve deficit.     Sensory: No sensory deficit.     Motor: No abnormal muscle tone.     Coordination: Coordination normal.  Psychiatric:        Mood and Affect: Mood normal.        Behavior: Behavior normal.        Thought Content: Thought content normal.        Judgment: Judgment normal.      ED Treatments / Results  Labs (all labs ordered are listed, but only abnormal results are displayed) Labs Reviewed  URINALYSIS, ROUTINE W REFLEX MICROSCOPIC  BASIC METABOLIC PANEL  CBC WITH DIFFERENTIAL/PLATELET    EKG None  Radiology Dg Ribs Unilateral Right  Result Date: 06/23/2018 CLINICAL DATA:  Pain after multiple falls over the past few days. EXAM: RIGHT RIBS - 2 VIEW COMPARISON:  CXR 04/09/2011 and chest CT 06/03/2003 FINDINGS: Multiple remote appearing right-sided rib fractures are identified involving the right lateral third, fourth, eighth, ninth and tenth ribs. No acute displaced rib fracture is seen. No pulmonary consolidation, effusion or pneumothorax. IMPRESSION: Multiple right-sided remote appearing rib fractures. No acute displaced rib fracture is identified. If there is pain out of proportion to radiographic findings, follow-up radiographs in 7-10 days may help reveal additional occult fractures. Electronically Signed   By: Ashley Royalty M.D.   On: 06/23/2018 22:32   Dg Pelvis 1-2 Views  Result Date: 06/23/2018 CLINICAL DATA:  Multiple falls over the past few days. Right hip pain. EXAM: PELVIS - 1-2 VIEW COMPARISON:  CT 06/03/2003 FINDINGS: Bilateral facet hypertrophy and sclerosis at L5-S1. No pelvic diastasis or acute fracture. Injection granulomata project over the left iliac bone. Mild protrusio deformity of  the left native hip, likely degenerative in etiology. The included right hip demonstrates a partially included right femoral nail without complicating features. No acute proximal femoral fracture is identified. IMPRESSION: 1. Lower lumbar facet arthropathy. 2. No acute pelvic or proximal femoral fracture is noted. Electronically Signed   By: Ashley Royalty M.D.   On: 06/23/2018 22:40    Procedures Procedures (including critical care time)  Medications Ordered in ED Medications - No data to display   Initial Impression / Assessment and Plan / ED Course  I have reviewed the triage vital signs and the nursing notes.  Pertinent labs & imaging results that were available during my care of the patient were reviewed by me and considered in my medical decision making (see chart for details).  Clinical Course as of Jun 22 2257  Sat Jun 23, 2018  2245 No evident acute fracture or infiltrate.  Images reviewed by me  DG Ribs Unilateral Right [EW]  2245 No fracture, image reviewed by me  DG Pelvis 1-2 Views [EW]  2245 BP(!): 130/59 [EW]  2254 Patient's son has now arrived and states that the patient is due to go to an assisted living at [place], in 2 days.  He also states that for the last 2 nights, before tonight, the patient has fallen and refused transport when EMS came to her home.  He feels like she is getting worse.  Tonight the patient fell after walking with her walker, to her bathroom.  The patient's son found her on the floor about 15 minutes after she fell.  He had been coming to her home to stay with her tonight, because of the prior falls.  The patient lives alone and has a caretaker who comes in a couple of hours a day.  She last saw her neurologist about Parkinson's disease, 1 month ago.  She has not seen her PCP for several months.  Apparently, she has never seen a physical therapist for assessment or treatment.   [EW]    Clinical Course User Index [EW] Daleen Bo, MD         Patient Vitals for the past 24 hrs:  BP Temp Temp src Pulse Resp SpO2 Height Weight  06/23/18 2230 (!) 107/57 - - (!) 59 - 97 % - -  06/23/18 2200 127/66 - - (!) 56 - 97 % - -  06/23/18 2130 103/68 - - (!) 56 - 93 % - -  06/23/18 2100 - - - - - - 4\' 10"  (1.473 m) 45.4 kg  06/23/18 2058 (!) 130/59 97.8 F (36.6 C) Oral (!) 58 16 98 % - -    10:56 PM Reevaluation with update and discussion. After initial assessment and treatment, an updated evaluation reveals no change in clinical status, findings discussed with patient and son, all questions answered. Daleen Bo   Medical Decision Making: Elderly patient who lives alone, and has had multiple falls recently.  No overt signs for injury.  Screening labs ordered.  Physical therapy consultation ordered.  Position planning awaiting physical therapy consultation; for recommendation of home versus placement.  CRITICAL CARE-no Performed by: Daleen Bo  Nursing Notes Reviewed/ Care Coordinated Applicable Imaging Reviewed Interpretation of Laboratory Data incorporated into ED treatment  Plan-as per oncoming provider team in conjunction with case management, and social work.  Final Clinical Impressions(s) / ED Diagnoses   Final diagnoses:  Pain  Contusion, multiple sites  Fall, initial encounter  Weakness    ED Discharge Orders    None       Daleen Bo, MD 06/25/18 [phone]

## 2018-06-23 NOTE — ED Notes (Signed)
Left voice message for [name] to follow up

## 2018-06-23 NOTE — ED Notes (Signed)
[name], son, [phone]  Pt home number [phone]

## 2018-06-23 NOTE — ED Triage Notes (Addendum)
Pt arrived to er by ems after having several falls over the past few days, c/o right hip pain, son reported to ems that pt was going to nursing home on Monday

## 2018-06-24 ENCOUNTER — Emergency Department (HOSPITAL_COMMUNITY): Payer: Medicare Other

## 2018-06-24 ENCOUNTER — Encounter (HOSPITAL_COMMUNITY): Payer: Self-pay | Admitting: Emergency Medicine

## 2018-06-24 DIAGNOSIS — I10 Essential (primary) hypertension: Secondary | ICD-10-CM

## 2018-06-24 DIAGNOSIS — R531 Weakness: Secondary | ICD-10-CM

## 2018-06-24 DIAGNOSIS — N179 Acute kidney failure, unspecified: Secondary | ICD-10-CM | POA: Diagnosis not present

## 2018-06-24 DIAGNOSIS — N183 Chronic kidney disease, stage 3 (moderate): Secondary | ICD-10-CM

## 2018-06-24 DIAGNOSIS — R55 Syncope and collapse: Secondary | ICD-10-CM | POA: Diagnosis present

## 2018-06-24 DIAGNOSIS — G20 Parkinson's disease: Secondary | ICD-10-CM

## 2018-06-24 DIAGNOSIS — E876 Hypokalemia: Secondary | ICD-10-CM | POA: Diagnosis present

## 2018-06-24 DIAGNOSIS — R5381 Other malaise: Secondary | ICD-10-CM | POA: Diagnosis present

## 2018-06-24 LAB — BASIC METABOLIC PANEL
Anion gap: 10 (ref 5–15)
BUN: 31 mg/dL — AB (ref 8–23)
CHLORIDE: 100 mmol/L (ref 98–111)
CO2: 32 mmol/L (ref 22–32)
CREATININE: 1.43 mg/dL — AB (ref 0.44–1.00)
Calcium: 10 mg/dL (ref 8.9–10.3)
GFR calc Af Amer: 35 mL/min — ABNORMAL LOW (ref >60)
GFR calc non Af Amer: 30 mL/min — ABNORMAL LOW (ref >60)
GLUCOSE: 121 mg/dL — AB (ref 70–99)
Potassium: 3.2 mmol/L — ABNORMAL LOW (ref 3.5–5.1)
Sodium: 142 mmol/L (ref 135–145)

## 2018-06-24 LAB — CBC WITH DIFFERENTIAL/PLATELET
Abs Immature Granulocytes: 0.06 10*3/uL (ref 0.00–0.07)
BASOS ABS: 0 10*3/uL (ref 0.0–0.1)
Basophils Relative: 0 %
EOS PCT: 3 %
Eosinophils Absolute: 0.4 10*3/uL (ref 0.0–0.5)
HEMATOCRIT: 40 % (ref 36.0–46.0)
HEMOGLOBIN: 12.4 g/dL (ref 12.0–15.0)
Immature Granulocytes: 1 %
LYMPHS ABS: 1.8 10*3/uL (ref 0.7–4.0)
LYMPHS PCT: 15 %
MCH: 29.4 pg (ref 26.0–34.0)
MCHC: 31 g/dL (ref 30.0–36.0)
MCV: 94.8 fL (ref 80.0–100.0)
MONOS PCT: 6 %
Monocytes Absolute: 0.7 10*3/uL (ref 0.1–1.0)
Neutro Abs: 8.7 10*3/uL — ABNORMAL HIGH (ref 1.7–7.7)
Neutrophils Relative %: 75 %
Platelets: 238 10*3/uL (ref 150–400)
RBC: 4.22 MIL/uL (ref 3.87–5.11)
RDW: 15.1 % (ref 11.5–15.5)
WBC: 11.7 10*3/uL — ABNORMAL HIGH (ref 4.0–10.5)
nRBC: 0 % (ref 0.0–0.2)

## 2018-06-24 MED ORDER — ACETAMINOPHEN 325 MG PO TABS
650.0000 mg | ORAL_TABLET | Freq: Four times a day (QID) | ORAL | Status: DC | PRN
  Administered 2018-06-24: 650 mg via ORAL
  Filled 2018-06-24: qty 2

## 2018-06-24 MED ORDER — SODIUM CHLORIDE 0.9 % IV BOLUS
250.0000 mL | Freq: Once | INTRAVENOUS | Status: AC
  Administered 2018-06-24: 250 mL via INTRAVENOUS

## 2018-06-24 MED ORDER — ONDANSETRON HCL 4 MG/2ML IJ SOLN: 4.0000 mg | Freq: Four times a day (QID) | INTRAMUSCULAR | Status: DC | PRN

## 2018-06-24 MED ORDER — ONDANSETRON HCL 4 MG PO TABS: 4.0000 mg | ORAL_TABLET | Freq: Four times a day (QID) | ORAL | Status: DC | PRN

## 2018-06-24 MED ORDER — SODIUM CHLORIDE 0.9 % IV SOLN
INTRAVENOUS | Status: DC
  Filled 2018-06-24 (×2): qty 1000

## 2018-06-24 NOTE — ED Notes (Signed)
From CT 

## 2018-06-24 NOTE — ED Notes (Addendum)
Pt was repositioned and applesauce given with tylenol  Pt can eat applesauce and thickened foods, but cannot take pills with water

## 2018-06-24 NOTE — H&P (Signed)
History and Physical    [name] [id number] DOB: [birth year] DOA: 06/23/2018  Referring MD/NP/PA: Dr. Sedonia Small PCP: Sinda Du, MD  Patient coming from: home  Chief Complaint: falls, generalized weakness; syncope.  HPI: [name] is a 85 y.o. female with past medical history significant for arthritis, hypertension, chronic kidney disease stage III (baseline GFR) and Parkinson's disease; who presented to the emergency department secondary to multiple episodes of falls and generalized weakness.  While in the emergency department patient was stood up in order and to assess capacity to perform activities and she syncopized.  Patient denies any chest pain, nausea, vomiting, headaches, focal weakness, fever, hematuria, dysuria, melena, hematochezia or any other complaints.  As per patient's son at bedside approximately 3 weeks ago she was started on amantadine to assist controlling better her Parkinson; which is the new medication in her regimen.  Patient son also reported decrease in her oral intake and just generalized weakness.  Overall condition has declined significantly in the last couple of weeks, making it unsafe for her current living situation.  In the ED blood work demonstrated acute on chronic renal failure, mild hypokalemia and a CT scan of the head without acute intracranial abnormalities.  Gentle fluid resuscitation given, amantadine discontinue.  TRH call to admit patient in observation for further evaluation and management of syncope along with assessment by physical therapy to determine safe discharge plans.  Past Medical/Surgical History: Past Medical History:  Diagnosis Date  . Arthritis   . Hypertension   . Parkinson disease [facility])     Past Surgical History:  Procedure Laterality Date  . ABDOMINAL HYSTERECTOMY    . APPENDECTOMY    . BREAST SURGERY Left    lumpectomy  . COMPRESSION HIP SCREW  04/10/2011   Procedure: COMPRESSION HIP;  Surgeon: Hessie Dibble,  MD;  Location: [facility];  Service: Orthopedics;  Laterality: Right;  . FRACTURE SURGERY    . MASS EXCISION Left 05/17/2017   Procedure: EXCISION LESION ON LEFT ARM;  Surgeon: Virl Cagey, MD;  Location: [facility];  Service: General;  Laterality: Left;    Social History:  reports that she has never smoked. She has never used smokeless tobacco. She reports that she does not drink alcohol or use drugs.  Allergies: No Known Allergies  Family History:  History reviewed. No pertinent family history.  Prior to Admission medications   Medication Sig Start Date End Date Taking? Authorizing Provider  amantadine (SYMMETREL) 100 MG capsule Take 100 mg by mouth 2 (two) times daily.   Yes [provider]  meclizine (ANTIVERT) 12.5 MG tablet Take 12.5 mg by mouth 4 (four) times daily as needed for dizziness.   Yes [provider]  carbidopa-levodopa (SINEMET) 25-100 MG per tablet Take 1 tablet by mouth 4 (four) times daily.      [provider]  cetirizine (ZYRTEC) 10 MG tablet Take 10 mg by mouth at bedtime.     [provider]  Cholecalciferol (VITAMIN D3) 2000 units TABS Take 2,000 Units by mouth at bedtime. 50 mcg    [provider]  gabapentin (NEURONTIN) 100 MG capsule Take 100 mg by mouth at bedtime. 04/19/17   [provider]  torsemide (DEMADEX) 20 MG tablet Take 20 mg by mouth daily at 2 PM. In the afternoon. 05/03/17   [provider]    Review of Systems:  Negative except as otherwise mentioned in HPI.    Physical Exam: Vitals:   06/24/18 1405  06/24/18 1515 06/24/18 1535 06/24/18 1619  BP: (!) 120/58   139/70  Pulse: (!) 57 (!) 58 99   Resp: 14 17 (!) 29   Temp:      TempSrc:      SpO2: 99% 97% (!) 81%   Weight:      Height:       Constitutional: NAD, calm, comfortable; no chest pain, no nausea, no vomiting. Eyes: PERRL, lids and conjunctivae normal; no icterus, no nystagmus. ENMT: Mucous membranes are moist.  Posterior pharynx clear of any exudate or lesions. Dentures in place. Neck: normal, supple, no masses, no thyromegaly, no JVD. Respiratory: clear to auscultation bilaterally, no wheezing, no crackles. Normal respiratory effort. No accessory muscle use.  Cardiovascular: Regular rate and rhythm, no murmurs / rubs / gallops. No extremity edema. 2+ pedal pulses. No carotid bruits.  Abdomen: no tenderness, no masses palpated. No hepatosplenomegaly. Bowel sounds positive.  Musculoskeletal: no clubbing / cyanosis. No joint deformity upper and lower extremities. Good ROM, no contractures. Normal muscle tone.  Skin: no rashes, lesions, ulcers. No induration Neurologic: CN 2-12 grossly intact. Sensation intact, DTR normal. Strength 3-4/5 in all 4 due to poor effort.  Psychiatric: Normal judgment and insight. Normal mood.    Labs on Admission: I have personally reviewed the following labs and imaging studies  CBC: Recent Labs  Lab 06/24/18 0022  WBC 11.7*  NEUTROABS 8.7*  HGB 12.4  HCT 40.0  MCV 94.8  PLT 379   Basic Metabolic Panel: Recent Labs  Lab 06/24/18 0022  NA 142  K 3.2*  CL 100  CO2 32  GLUCOSE 121*  BUN 31*  CREATININE 1.43*  CALCIUM 10.0   GFR: Estimated Creatinine Clearance: 13.8 mL/min (A) (by C-G formula based on SCr of 1.43 mg/dL (H)).  Urine analysis:    Component Value Date/Time   COLORURINE YELLOW 06/23/2018 2319   APPEARANCEUR CLEAR 06/23/2018 2319   LABSPEC 1.014 06/23/2018 2319   PHURINE 5.0 06/23/2018 2319   GLUCOSEU NEGATIVE 06/23/2018 2319   HGBUR NEGATIVE 06/23/2018 2319   BILIRUBINUR NEGATIVE 06/23/2018 2319   KETONESUR NEGATIVE 06/23/2018 2319   PROTEINUR NEGATIVE 06/23/2018 2319   UROBILINOGEN 0.2 04/09/2011 1700   NITRITE NEGATIVE 06/23/2018 2319   LEUKOCYTESUR NEGATIVE 06/23/2018 2319   Radiological Exams on Admission: Dg Ribs Unilateral Right  Result Date: 06/23/2018 CLINICAL DATA:  Pain after multiple falls over the past few days. EXAM:  RIGHT RIBS - 2 VIEW COMPARISON:  CXR 04/09/2011 and chest CT 06/03/2003 FINDINGS: Multiple remote appearing right-sided rib fractures are identified involving the right lateral third, fourth, eighth, ninth and tenth ribs. No acute displaced rib fracture is seen. No pulmonary consolidation, effusion or pneumothorax. IMPRESSION: Multiple right-sided remote appearing rib fractures. No acute displaced rib fracture is identified. If there is pain out of proportion to radiographic findings, follow-up radiographs in 7-10 days may help reveal additional occult fractures. Electronically Signed   By: Ashley Royalty M.D.   On: 06/23/2018 22:32   Dg Pelvis 1-2 Views  Result Date: 06/23/2018 CLINICAL DATA:  Multiple falls over the past few days. Right hip pain. EXAM: PELVIS - 1-2 VIEW COMPARISON:  CT 06/03/2003 FINDINGS: Bilateral facet hypertrophy and sclerosis at L5-S1. No pelvic diastasis or acute fracture. Injection granulomata project over the left iliac bone. Mild protrusio deformity of the left native hip, likely degenerative in etiology. The included right hip demonstrates a partially included right femoral nail without complicating features. No acute proximal femoral fracture is identified. IMPRESSION: 1.  Lower lumbar facet arthropathy. 2. No acute pelvic or proximal femoral fracture is noted. Electronically Signed   By: Ashley Royalty M.D.   On: 06/23/2018 22:40   Ct Head Wo Contrast  Result Date: 06/24/2018 CLINICAL DATA:  Multiple falls, bruising to right forehead EXAM: CT HEAD WITHOUT CONTRAST TECHNIQUE: Contiguous axial images were obtained from the base of the skull through the vertex without intravenous contrast. COMPARISON:  11/22/2012 FINDINGS: Brain: No evidence of acute infarction, hemorrhage, hydrocephalus, extra-axial collection or mass lesion/mass effect. Periventricular white matter hypodensity and global volume loss. Vascular: No hyperdense vessel or unexpected calcification. Skull: Normal. Negative for  fracture or focal lesion. Sinuses/Orbits: No acute finding. Densely inspissated secretions in the left maxillary sinus as on prior examination. Other: None. IMPRESSION: No acute intracranial pathology. Small-vessel white matter disease and global volume loss. Electronically Signed   By: Eddie Candle M.D.   On: 06/24/2018 12:47    EKG: none   Assessment/Plan 1-syncope and collapse -With positive orthostatic changes -Will provide fluid resuscitation overnight -Discontinue Demadex and amantadine -Repeat orthostatic vital signs in a.m. -Check TSH and monitor on telemetry. -Follow clinical response.  2-generalized weakness/deconditioning -Most likely associated with further decline from her Parkinson's disease -Will check TSH and B12 to be thorough -Will discontinue Demadex and provide fluid resuscitation -Physical therapy consultation for assessment and recommendations in place.  3-hypertension: Currently with positive orthostatic changes -Will discontinue Demadex -Follow vital signs. -Providing IV fluids overnights.  4-Parkinson's disease -Continue carbidopa levodopa -Continue supportive care -Amantadine has been discontinue in the setting of acute dizziness and orthostatic changes.  5-dysphasia -Associated with underlying Parkinson disease -Dysphagia 2 diet has been order -Speech therapy evaluation has been requested.  6-history of rhinitis -Continue loratadine  7-history of arthritis/neuropathy -Continue PRN analgesics and use of Neurontin nightly.   DVT prophylaxis: SCD's Code Status: Full (patient's son will discuss with mother and other family members to come to final decision regarding CODE STATUS) Family Communication: Son at bedside Disposition Plan: To be determined; patient currently unsafe to continue prior to admission living arrangements. Consults called: None Admission status: Observation, telemetry, length of stay less than 2 midnights.   Time Spent: 60  minutes  Barton Dubois MD [facility] Pager [phone]  06/24/2018, 4:44 PM

## 2018-06-24 NOTE — ED Notes (Signed)
Pt noted to be more confused than earlier today.  Skin turgor very poor and bp is soft.  Orthostatics done and Dr. Sedonia Small made aware. Pt had syncopal episode upon standing with significant drop in BP.

## 2018-06-24 NOTE — ED Notes (Signed)
hospitalist at bedside

## 2018-06-24 NOTE — ED Notes (Signed)
Spoke with pt's son and reconciled medications.  Son states pt is scheduled to go to the [facility] in [place] tomorrow for assisted living, but he is concerned about her ability to function at assisted living which is why he is reluctant to take her home.

## 2018-06-24 NOTE — ED Notes (Signed)
Dr. Sedonia Small at bedside speaking with family and pt.

## 2018-06-24 NOTE — ED Notes (Signed)
Pt is conversant  Her extremities are cool Unable to ascertain BP- warm blanket for complaint of cold

## 2018-06-24 NOTE — Progress Notes (Signed)
PT Cancellation Note  Patient Details Name: [name] MRN: [id number] DOB: 01-03-1917   Cancelled Treatment:    Reason Eval/Treat Not Completed: Medical issues which prohibited therapy. PT spoke with nursing; orthostatic BP drop; fainted; defer PT eval.   Floria Raveling. Hartnett-Rands, MS, PT Per [facility] #[id number] 06/24/2018, 12:26 PM

## 2018-06-24 NOTE — ED Notes (Signed)
Visitors at bedside.

## 2018-06-24 NOTE — ED Provider Notes (Signed)
I was made aware of this patient by nursing staff.  85 year old female with history of Parkinson's, with the consensus being that the plan was for her to be placed in an assisted living facility.  According to both nursing and patient's son, patient is more confused this morning, did not recognize her son.  Patient has had 3 falls, once per day for the past 3 days.  Very abnormal for her.  Son does endorse head trauma.  Physical therapy assessment was attempted this morning, during which patient stood up, became dizzy, and lost consciousness.  Blood pressure 60 systolic, quickly returning to normal when patient was laid supine.  According to son, patient recently started amantadine few weeks ago, and according to the literature, this medication is associated with orthostatic hypotension.  Given this constellation of findings, will obtain CT head to exclude intracranial bleeding, will reconsult case management to determine if there is room at the [facility] for this patient, as this is a full nursing home that would be able to more appropriately assist her.  We will also consider admission.  Barth Kirks. Sedonia Small, [place] [email or web address]    Maudie Flakes, MD 06/24/18 [phone]

## 2018-06-25 DIAGNOSIS — T148XXA Other injury of unspecified body region, initial encounter: Secondary | ICD-10-CM | POA: Diagnosis present

## 2018-06-25 DIAGNOSIS — Z6820 Body mass index (BMI) 20.0-20.9, adult: Secondary | ICD-10-CM | POA: Diagnosis not present

## 2018-06-25 DIAGNOSIS — Z66 Do not resuscitate: Secondary | ICD-10-CM | POA: Diagnosis present

## 2018-06-25 DIAGNOSIS — E860 Dehydration: Secondary | ICD-10-CM | POA: Diagnosis present

## 2018-06-25 DIAGNOSIS — W19XXXA Unspecified fall, initial encounter: Secondary | ICD-10-CM | POA: Diagnosis present

## 2018-06-25 DIAGNOSIS — Y92009 Unspecified place in unspecified non-institutional (private) residence as the place of occurrence of the external cause: Secondary | ICD-10-CM | POA: Diagnosis not present

## 2018-06-25 DIAGNOSIS — G9341 Metabolic encephalopathy: Secondary | ICD-10-CM | POA: Diagnosis present

## 2018-06-25 DIAGNOSIS — R296 Repeated falls: Secondary | ICD-10-CM | POA: Diagnosis present

## 2018-06-25 DIAGNOSIS — G20 Parkinson's disease: Secondary | ICD-10-CM | POA: Diagnosis present

## 2018-06-25 DIAGNOSIS — I129 Hypertensive chronic kidney disease with stage 1 through stage 4 chronic kidney disease, or unspecified chronic kidney disease: Secondary | ICD-10-CM | POA: Diagnosis present

## 2018-06-25 DIAGNOSIS — E876 Hypokalemia: Secondary | ICD-10-CM | POA: Diagnosis present

## 2018-06-25 DIAGNOSIS — N183 Chronic kidney disease, stage 3 (moderate): Secondary | ICD-10-CM | POA: Diagnosis present

## 2018-06-25 DIAGNOSIS — R55 Syncope and collapse: Secondary | ICD-10-CM | POA: Diagnosis present

## 2018-06-25 DIAGNOSIS — R627 Adult failure to thrive: Secondary | ICD-10-CM | POA: Diagnosis present

## 2018-06-25 DIAGNOSIS — N179 Acute kidney failure, unspecified: Secondary | ICD-10-CM | POA: Diagnosis present

## 2018-06-25 LAB — BASIC METABOLIC PANEL
ANION GAP: 10 (ref 5–15)
BUN: 20 mg/dL (ref 8–23)
CO2: 29 mmol/L (ref 22–32)
Calcium: 9.6 mg/dL (ref 8.9–10.3)
Chloride: 108 mmol/L (ref 98–111)
Creatinine, Ser: 0.96 mg/dL (ref 0.44–1.00)
GFR calc Af Amer: 57 mL/min — ABNORMAL LOW (ref >60)
GFR calc non Af Amer: 49 mL/min — ABNORMAL LOW (ref >60)
Glucose, Bld: 101 mg/dL — ABNORMAL HIGH (ref 70–99)
POTASSIUM: 4.3 mmol/L (ref 3.5–5.1)
Sodium: 147 mmol/L — ABNORMAL HIGH (ref 135–145)

## 2018-06-25 LAB — MAGNESIUM: Magnesium: 2.1 mg/dL (ref 1.7–2.4)

## 2018-06-25 LAB — TSH: TSH: 1.214 u[IU]/mL (ref 0.350–4.500)

## 2018-06-25 LAB — PHOSPHORUS: Phosphorus: 2.3 mg/dL — ABNORMAL LOW (ref 2.5–4.6)

## 2018-06-25 LAB — CORTISOL: Cortisol, Plasma: 17.7 ug/dL

## 2018-06-25 LAB — VITAMIN B12: Vitamin B-12: 321 pg/mL (ref 180–914)

## 2018-06-25 MED ORDER — POTASSIUM CHLORIDE IN NACL 40-0.9 MEQ/L-% IV SOLN
INTRAVENOUS | Status: AC
  Administered 2018-06-25: 75 mL/h via INTRAVENOUS

## 2018-06-25 MED ORDER — POTASSIUM CHLORIDE IN NACL 40-0.9 MEQ/L-% IV SOLN
INTRAVENOUS | Status: DC
  Administered 2018-06-25: 75 mL/h via INTRAVENOUS

## 2018-06-25 NOTE — Progress Notes (Signed)
Subjective: She is still very weak.  She is confused this morning.  Objective: Vital signs in last 24 hours: Temp:  [97.3 F (36.3 C)-98.2 F (36.8 C)] 98.2 F (36.8 C) (03/09 0615) Pulse Rate:  [56-109] 100 (03/09 0615) Resp:  [14-29] 18 (03/09 0615) BP: (96-157)/(29-88) 149/88 (03/09 0615) SpO2:  [81 %-99 %] 92 % (03/09 0628) Weight:  [43 kg-43.5 kg] 43.5 kg (03/09 0500) Weight change: -2.36 kg Last BM Date: (pt unable to state)  Intake/Output from previous day: 03/08 0701 - 03/09 0700 In: 120 [P.O.:120] Out: 400 [Urine:400]  PHYSICAL EXAM General appearance: alert, cooperative and no distress Resp: clear to auscultation bilaterally Cardio: regular rate and rhythm, S1, S2 normal, no murmur, click, rub or gallop GI: soft, non-tender; bowel sounds normal; no masses,  no organomegaly Extremities: extremities normal, atraumatic, no cyanosis or edema  Lab Results:  Results for orders placed or performed during the hospital encounter of 06/23/18 (from the past 48 hour(s))  Urinalysis, Routine w reflex microscopic     Status: None   Collection Time: 06/23/18 11:19 PM  Result Value Ref Range   Color, Urine YELLOW YELLOW   APPearance CLEAR CLEAR   Specific Gravity, Urine 1.014 1.005 - 1.030   pH 5.0 5.0 - 8.0   Glucose, UA NEGATIVE NEGATIVE mg/dL   Hgb urine dipstick NEGATIVE NEGATIVE   Bilirubin Urine NEGATIVE NEGATIVE   Ketones, ur NEGATIVE NEGATIVE mg/dL   Protein, ur NEGATIVE NEGATIVE mg/dL   Nitrite NEGATIVE NEGATIVE   Leukocytes,Ua NEGATIVE NEGATIVE    Comment: Performed at [facility], [address] [place], [place] [address]  Basic metabolic panel     Status: Abnormal   Collection Time: 06/24/18 12:22 AM  Result Value Ref Range   Sodium 142 135 - 145 mmol/L   Potassium 3.2 (L) 3.5 - 5.1 mmol/L   Chloride 100 98 - 111 mmol/L   CO2 32 22 - 32 mmol/L   Glucose, Bld 121 (H) 70 - 99 mg/dL   BUN 31 (H) 8 - 23 mg/dL   Creatinine, Ser 1.43 (H) 0.44 - 1.00 mg/dL   Calcium 10.0 8.9 - 10.3 mg/dL   GFR calc non Af Amer 30 (L) >60 mL/min   GFR calc Af Amer 35 (L) >60 mL/min   Anion gap 10 5 - 15    Comment: Performed at [facility], [address], [place], [place] [address]  CBC with Differential     Status: Abnormal   Collection Time: 06/24/18 12:22 AM  Result Value Ref Range   WBC 11.7 (H) 4.0 - 10.5 K/uL   RBC 4.22 3.87 - 5.11 MIL/uL   Hemoglobin 12.4 12.0 - 15.0 g/dL   HCT 40.0 36.0 - 46.0 %   MCV 94.8 80.0 - 100.0 fL   MCH 29.4 26.0 - 34.0 pg   MCHC 31.0 30.0 - 36.0 g/dL   RDW 15.1 11.5 - 15.5 %   Platelets 238 150 - 400 K/uL   nRBC 0.0 0.0 - 0.2 %   Neutrophils Relative % 75 %   Neutro Abs 8.7 (H) 1.7 - 7.7 K/uL   Lymphocytes Relative 15 %   Lymphs Abs 1.8 0.7 - 4.0 K/uL   Monocytes Relative 6 %   Monocytes Absolute 0.7 0.1 - 1.0 K/uL   Eosinophils Relative 3 %   Eosinophils Absolute 0.4 0.0 - 0.5 K/uL   Basophils Relative 0 %   Basophils Absolute 0.0 0.0 - 0.1 K/uL   Immature Granulocytes 1 %  Abs Immature Granulocytes 0.06 0.00 - 0.07 K/uL    Comment: Performed at [facility], [address], [place], [place] [address]  Basic metabolic panel     Status: Abnormal   Collection Time: 06/25/18  4:35 AM  Result Value Ref Range   Sodium 147 (H) 135 - 145 mmol/L   Potassium 4.3 3.5 - 5.1 mmol/L    Comment: DELTA CHECK NOTED   Chloride 108 98 - 111 mmol/L   CO2 29 22 - 32 mmol/L   Glucose, Bld 101 (H) 70 - 99 mg/dL   BUN 20 8 - 23 mg/dL   Creatinine, Ser 0.96 0.44 - 1.00 mg/dL   Calcium 9.6 8.9 - 10.3 mg/dL   GFR calc non Af Amer 49 (L) >60 mL/min   GFR calc Af Amer 57 (L) >60 mL/min   Anion gap 10 5 - 15    Comment: Performed at [facility], [address], [place], [place] [address]  TSH     Status: None   Collection Time: 06/25/18  4:35 AM  Result Value Ref Range   TSH 1.214 0.350 - 4.500 uIU/mL    Comment: Performed by a 3rd Generation assay with a functional sensitivity of <=0.01 uIU/mL. Performed at [facility],  [address] [place], [place] [address]   Vitamin B12     Status: None   Collection Time: 06/25/18  4:35 AM  Result Value Ref Range   Vitamin B-12 321 180 - 914 pg/mL    Comment: (NOTE) This assay is not validated for testing neonatal or myeloproliferative syndrome specimens for Vitamin B12 levels. Performed at [facility], [address], [place], [place] [address]   Magnesium     Status: None   Collection Time: 06/25/18  4:35 AM  Result Value Ref Range   Magnesium 2.1 1.7 - 2.4 mg/dL    Comment: Performed at [facility], [address] [place], [place] [address]  Phosphorus     Status: Abnormal   Collection Time: 06/25/18  4:35 AM  Result Value Ref Range   Phosphorus 2.3 (L) 2.5 - 4.6 mg/dL    Comment: Performed at [facility], [address] [place],  [address]    ABGS No results for input(s): PHART, PO2ART, TCO2, HCO3 in the last 72 hours.  Invalid input(s): PCO2 CULTURES No results found for this or any previous visit (from the past 240 hour(s)). Studies/Results: Dg Ribs Unilateral Right  Result Date: 06/23/2018 CLINICAL DATA:  Pain after multiple falls over the past few days. EXAM: RIGHT RIBS - 2 VIEW COMPARISON:  CXR 04/09/2011 and chest CT 06/03/2003 FINDINGS: Multiple remote appearing right-sided rib fractures are identified involving the right lateral third, fourth, eighth, ninth and tenth ribs. No acute displaced rib fracture is seen. No pulmonary consolidation, effusion or pneumothorax. IMPRESSION: Multiple right-sided remote appearing rib fractures. No acute displaced rib fracture is identified. If there is pain out of proportion to radiographic findings, follow-up radiographs in 7-10 days may help reveal additional occult fractures. Electronically Signed   By: Ashley Royalty M.D.   On: 06/23/2018 22:32   Dg Pelvis 1-2 Views  Result Date: 06/23/2018 CLINICAL DATA:  Multiple falls over the past few days. Right hip pain. EXAM: PELVIS - 1-2 VIEW COMPARISON:  CT 06/03/2003  FINDINGS: Bilateral facet hypertrophy and sclerosis at L5-S1. No pelvic diastasis or acute fracture. Injection granulomata project over the left iliac bone. Mild protrusio deformity of the left native hip, likely degenerative in etiology. The included right  hip demonstrates a partially included right femoral nail without complicating features. No acute proximal femoral fracture is identified. IMPRESSION: 1. Lower lumbar facet arthropathy. 2. No acute pelvic or proximal femoral fracture is noted. Electronically Signed   By: Ashley Royalty M.D.   On: 06/23/2018 22:40   Ct Head Wo Contrast  Result Date: 06/24/2018 CLINICAL DATA:  Multiple falls, bruising to right forehead EXAM: CT HEAD WITHOUT CONTRAST TECHNIQUE: Contiguous axial images were obtained from the base of the skull through the vertex without intravenous contrast. COMPARISON:  11/22/2012 FINDINGS: Brain: No evidence of acute infarction, hemorrhage, hydrocephalus, extra-axial collection or mass lesion/mass effect. Periventricular white matter hypodensity and global volume loss. Vascular: No hyperdense vessel or unexpected calcification. Skull: Normal. Negative for fracture or focal lesion. Sinuses/Orbits: No acute finding. Densely inspissated secretions in the left maxillary sinus as on prior examination. Other: None. IMPRESSION: No acute intracranial pathology. Small-vessel white matter disease and global volume loss. Electronically Signed   By: Eddie Candle M.D.   On: 06/24/2018 12:47    Medications:  Prior to Admission:  Medications Prior to Admission  Medication Sig Dispense Refill Last Dose  . amantadine (SYMMETREL) 100 MG capsule Take 100 mg by mouth 2 (two) times daily.     . meclizine (ANTIVERT) 12.5 MG tablet Take 12.5 mg by mouth 4 (four) times daily as needed for dizziness.     . carbidopa-levodopa (SINEMET) 25-100 MG per tablet Take 1 tablet by mouth 4 (four) times daily.     05/17/2017 at 0630  . cetirizine (ZYRTEC) 10 MG tablet Take 10  mg by mouth at bedtime.    05/16/2017 at Unknown time  . Cholecalciferol (VITAMIN D3) 2000 units TABS Take 2,000 Units by mouth at bedtime. 50 mcg   05/16/2017 at Unknown time  . gabapentin (NEURONTIN) 100 MG capsule Take 100 mg by mouth at bedtime.  1 05/16/2017 at Unknown time  . torsemide (DEMADEX) 20 MG tablet Take 20 mg by mouth daily at 2 PM. In the afternoon.  1 05/16/2017 at Unknown time   Scheduled: . carbidopa-levodopa  1 tablet Oral QID  . cholecalciferol  2,000 Units Oral QHS  . gabapentin  100 mg Oral QHS  . loratadine  10 mg Oral Daily   Continuous: . 0.9 % NaCl with KCl 40 mEq / L 75 mL/hr (06/25/18 0728)   OPF:[id number], ondansetron **OR** ondansetron (ZOFRAN) IV, traMADol  Assesment: She was admitted with syncope.  She has acute on chronic renal failure.  At baseline she has Parkinson's disease which seems to gradually be getting worse.  She has failure to thrive and PT consultation has been initiated.  Recommendation will be for skilled care facility for rehab  I think her renal failure is from dehydration because she is not been eating or drinking well and she is been on torsemide at home Principal Problem:   Syncope and collapse Active Problems:   Hypertension   Parkinson disease ([place])   Physical deconditioning   Acute renal failure superimposed on stage 3 chronic kidney disease ([place])   Hypokalemia    Plan: Continue treatments.    LOS: 0 days   [name] 06/25/2018, 8:57 AM

## 2018-06-25 NOTE — Plan of Care (Signed)
  Problem: Acute Rehab PT Goals(only PT should resolve) Goal: Pt Will Go Supine/Side To Sit Outcome: Progressing Flowsheets (Taken 06/25/2018 1230) Pt will go Supine/Side to Sit: with moderate assist Goal: Patient Will Transfer Sit To/From Stand Outcome: Progressing Flowsheets (Taken 06/25/2018 1230) Patient will transfer sit to/from stand: with moderate assist Goal: Pt Will Transfer Bed To Chair/Chair To Bed Outcome: Progressing Flowsheets (Taken 06/25/2018 1230) Pt will Transfer Bed to Chair/Chair to Bed: with mod assist Goal: Pt Will Ambulate Outcome: Progressing Flowsheets (Taken 06/25/2018 1230) Pt will Ambulate: 25 feet; with minimal assist; with moderate assist; with rolling walker   12:30 PM, 06/25/18 Lonell Grandchild, MPT Physical Therapist with [facility] [phone] office [phone] mobile phone

## 2018-06-25 NOTE — Progress Notes (Signed)
Discussed Advance Directives and DNR with [name], [name]' son. [name] has completed AD documents which he shared had been copied for her chart. He is her HCPOA. He plans to talk with Dr Luan Pulling about the DNR.

## 2018-06-25 NOTE — NC FL2 (Signed)
[place] LEVEL OF CARE SCREENING TOOL     IDENTIFICATION  Patient Name: [name] Birthdate: 01-18-1917 Sex: female Admission Date (Current Location): 06/23/2018  [place] and [place] Number:  [place] [facility] [id number] [place] and Address:  [place] [address], [place]      Provider Number: [id number]  Attending Physician Name and Address:  Sinda Du, MD  Relative Name and Phone Number:  [name] (son) [phone] (cell)    Current Level of Care: Hospital Recommended Level of Care: [place] Prior Approval Number:    Date Approved/Denied:   PASRR Number: [id number]  Discharge Plan: SNF    Current Diagnoses: Patient Active Problem List   Diagnosis Date Noted  . Syncope and collapse 06/24/2018  . Physical deconditioning 06/24/2018  . Acute renal failure superimposed on stage 3 chronic kidney disease ([employer]) 06/24/2018  . Hypokalemia 06/24/2018  . Squamous cell cancer of skin of right forearm 05/25/2017  . Hip fracture ([place]) 04/14/2011  . Hypertension   . Parkinson disease ([place])     Orientation RESPIRATION BLADDER Height & Weight     Self, Place  Normal Continent Weight: 43.5 kg Height:  4\' 10"  (147.3 cm)  BEHAVIORAL SYMPTOMS/MOOD NEUROLOGICAL BOWEL NUTRITION STATUS      Continent Diet(dysphagia 2, thin liquids)  AMBULATORY STATUS COMMUNICATION OF NEEDS Skin   Limited Assist Verbally Normal                       Personal Care Assistance Level of Assistance  Bathing, Feeding, Dressing Bathing Assistance: Limited assistance Feeding assistance: Limited assistance Dressing Assistance: Limited assistance     Functional Limitations Info  Sight, Hearing, Speech Sight Info: Adequate Hearing Info: Adequate Speech Info: Adequate    SPECIAL CARE FACTORS FREQUENCY  PT (By licensed PT)     PT Frequency: 5x/week              Contractures Contractures Info: Not present     Additional Factors Info  Code Status, Allergies, Psychotropic Code Status Info: Full code Allergies Info: No known allergies Psychotropic Info: Sinemet         Current Medications (06/25/2018):  This is the current hospital active medication list Current Facility-Administered Medications  Medication Dose Route Frequency Provider Last Rate Last Dose  . acetaminophen (TYLENOL) tablet 650 mg  650 mg Oral Q6H PRN Barton Dubois, MD   650 mg at 06/24/18 1520  . carbidopa-levodopa (SINEMET IR) 25-100 MG per tablet immediate release 1 tablet  1 tablet Oral QID Barton Dubois, MD   1 tablet at 06/25/18 [phone]  . cholecalciferol (VITAMIN D3) tablet 2,000 Units  2,000 Units Oral Morrell Riddle, MD   2,000 Units at 06/24/18 2116  . gabapentin (NEURONTIN) capsule 100 mg  100 mg Oral QHS Barton Dubois, MD   100 mg at 06/24/18 2116  . loratadine (CLARITIN) tablet 10 mg  10 mg Oral Daily Barton Dubois, MD   10 mg at 06/25/18 2025  . ondansetron (ZOFRAN) tablet 4 mg  4 mg Oral Q6H PRN Barton Dubois, MD       Or  . ondansetron [facility]) injection 4 mg  4 mg Intravenous Q6H PRN Barton Dubois, MD      . traMADol [facility]at[facility]bon) tablet 25-50 mg  25-50 mg Oral Q12H PRN Barton Dubois, MD   50 mg at 06/25/18 4270     Discharge Medications: Please see discharge summary for a list of discharge medications.  Relevant Imaging Results:  Relevant Lab Results:   Additional Information SS # [id number]; son had worked on plan for patient to go to [facility] in [place] ; they were going to take patient today but now needs SNF rehab  Jermale Crass, [place]un[place]y [place], RN

## 2018-06-25 NOTE — Evaluation (Signed)
Clinical/Bedside Swallow Evaluation Patient Details  Name: [name] MRN: [id number] Date of Birth: [birth year]  Today's Date: 06/25/2018 Time: SLP Start Time (ACUTE ONLY): 1237 SLP Stop Time (ACUTE ONLY): 1312 SLP Time Calculation (min) (ACUTE ONLY): 35 min  Past Medical History:  Past Medical History:  Diagnosis Date  . Arthritis   . Hypertension   . Parkinson disease [facility])    Past Surgical History:  Past Surgical History:  Procedure Laterality Date  . ABDOMINAL HYSTERECTOMY    . APPENDECTOMY    . BREAST SURGERY Left    lumpectomy  . COMPRESSION HIP SCREW  04/10/2011   Procedure: COMPRESSION HIP;  Surgeon: Hessie Dibble, MD;  Location: [facility];  Service: Orthopedics;  Laterality: Right;  . FRACTURE SURGERY    . MASS EXCISION Left 05/17/2017   Procedure: EXCISION LESION ON LEFT ARM;  Surgeon: Virl Cagey, MD;  Location: [facility];  Service: General;  Laterality: Left;   HPI:  [name] is a 85 y.o. female with past medical history significant for arthritis, hypertension, chronic kidney disease stage III (baseline GFR) and Parkinson's disease; who presented to the emergency department secondary to multiple episodes of falls and generalized weakness.  While in the emergency department patient was stood up in order and to assess capacity to perform activities and she syncopized.  Patient denies any chest pain, nausea, vomiting, headaches, focal weakness, fever, hematuria, dysuria, melena, hematochezia or any other complaints. BSE requested.   Assessment / Plan / Recommendation Clinical Impression  Clinical swallow evaluation completed at bedside. Pt was received reclined in bed with head flexed sharply to the left due c/o right neck pain. Pt repositioned, but continued to c/o discomfort (RN notified). Pt assessed without her dentures, as she reports eating without them as they are loose fitting. Pt assessed with ice chips, thin water via cup and straw, puree, and D2. Pt had  difficulty with oral control when taking cups sips (reduced labial closure around cup), but did well with small, straw sips. Pt with prolonged oral phase with D2 and benefited from liquid wash. PO intake was limited, as Pt repeatedly declined additional po. Recommend D2 and thin (this is her home baseline diet per son) and po medications whole in puree or crushed in puree as needed. Pt will need 100% feeder assist. Overall oral intake is likely to be limited, so continued offerings of Magic Cup is a good idea (she liked this when presented, but intake limited). No further SLP services indicated at this time. SLP will sign off. Reconsult if indicated. Above discussed with Pt's son and Therapist, sports.   SLP Visit Diagnosis: Dysphagia, unspecified (R13.10)    Aspiration Risk  Mild aspiration risk    Diet Recommendation Dysphagia 2 (Fine chop);Thin liquid   Liquid Administration via: Cup;Straw Medication Administration: Whole meds with puree(may need to crush pills as able in puree) Supervision: Staff to assist with self feeding;Full supervision/cueing for compensatory strategies Compensations: Slow rate;Small sips/bites;Lingual sweep for clearance of pocketing Postural Changes: Seated upright at 90 degrees;Remain upright for at least 30 minutes after po intake    Other  Recommendations Oral Care Recommendations: Oral care BID;Staff/trained caregiver to provide oral care Other Recommendations: Clarify dietary restrictions   Follow up Recommendations Skilled Nursing facility      Frequency and Duration            Prognosis Prognosis for Safe Diet Advancement: Fair      Swallow Study   General Date of Onset:  06/25/18 HPI: [name] is a 85 y.o. female with past medical history significant for arthritis, hypertension, chronic kidney disease stage III (baseline GFR) and Parkinson's disease; who presented to the emergency department secondary to multiple episodes of falls and generalized weakness.   While in the emergency department patient was stood up in order and to assess capacity to perform activities and she syncopized.  Patient denies any chest pain, nausea, vomiting, headaches, focal weakness, fever, hematuria, dysuria, melena, hematochezia or any other complaints. BSE requested. Type of Study: Bedside Swallow Evaluation Previous Swallow Assessment: BSE in 2012 Diet Prior to this Study: Dysphagia 2 (chopped);Thin liquids Temperature Spikes Noted: No Respiratory Status: Room air History of Recent Intubation: No Behavior/Cognition: Alert;Cooperative;Pleasant mood;Requires cueing Oral Cavity Assessment: Within Functional Limits Oral Care Completed by SLP: Yes Oral Cavity - Dentition: Edentulous(has loose fitting U/L dentures) Vision: Impaired for self-feeding Self-Feeding Abilities: Needs assist;Total assist Patient Positioning: Upright in bed Baseline Vocal Quality: Normal Volitional Cough: Weak Volitional Swallow: Able to elicit    Oral/Motor/Sensory Function Overall Oral Motor/Sensory Function: Within functional limits   Ice Chips Ice chips: Within functional limits Presentation: Spoon   Thin Liquid Thin Liquid: Impaired Presentation: Cup;Straw Oral Phase Impairments: Reduced labial seal(difficuty with cup sips; better with straw ) Pharyngeal  Phase Impairments: Suspected delayed Swallow    Nectar Thick Nectar Thick Liquid: Not tested   Honey Thick Honey Thick Liquid: Not tested   Puree Puree: Within functional limits Presentation: Spoon   Solid     Solid: Impaired Presentation: Spoon Oral Phase Impairments: Impaired mastication Oral Phase Functional Implications: Impaired mastication;Prolonged oral transit     Thank you,  Genene Churn, [place]  , 06/25/2018,1:27 PM

## 2018-06-25 NOTE — Plan of Care (Signed)
  Problem: Health Behavior/Discharge Planning: Goal: Ability to manage health-related needs will improve Outcome: Progressing   Problem: Clinical Measurements: Goal: Will remain free from infection Outcome: Progressing Goal: Respiratory complications will improve Outcome: Progressing Goal: Cardiovascular complication will be avoided Outcome: Progressing   Problem: Nutrition: Goal: Adequate nutrition will be maintained Outcome: Progressing   Problem: Elimination: Goal: Will not experience complications related to bowel motility Outcome: Progressing   Problem: Pain Managment: Goal: General experience of comfort will improve Outcome: Progressing   Problem: Skin Integrity: Goal: Risk for impaired skin integrity will decrease Outcome: Progressing   Problem: Education: Goal: Knowledge of General Education information will improve Description Including pain rating scale, medication(s)/side effects and non-pharmacologic comfort measures Outcome: Progressing   Problem: Health Behavior/Discharge Planning: Goal: Ability to manage health-related needs will improve Outcome: Progressing   Problem: Clinical Measurements: Goal: Ability to maintain clinical measurements within normal limits will improve Outcome: Progressing Goal: Will remain free from infection Outcome: Progressing Goal: Diagnostic test results will improve Outcome: Progressing Goal: Respiratory complications will improve Outcome: Progressing Goal: Cardiovascular complication will be avoided Outcome: Progressing   Problem: Activity: Goal: Risk for activity intolerance will decrease Outcome: Progressing   Problem: Nutrition: Goal: Adequate nutrition will be maintained Outcome: Progressing   Problem: Coping: Goal: Level of anxiety will decrease Outcome: Progressing   Problem: Elimination: Goal: Will not experience complications related to bowel motility Outcome: Progressing Goal: Will not experience  complications related to urinary retention Outcome: Progressing   Problem: Pain Managment: Goal: General experience of comfort will improve Outcome: Progressing   Problem: Safety: Goal: Ability to remain free from injury will improve Outcome: Progressing   Problem: Skin Integrity: Goal: Risk for impaired skin integrity will decrease Outcome: Progressing

## 2018-06-25 NOTE — Evaluation (Signed)
Physical Therapy Evaluation Patient Details Name: [name] MRN: [id number] DOB: 04/25/1916 Today's Date: 06/25/2018   History of Present Illness  [name] is a 85 y.o. female with past medical history significant for arthritis, hypertension, chronic kidney disease stage III (baseline GFR) and Parkinson's disease; who presented to the emergency department secondary to multiple episodes of falls and generalized weakness.  While in the emergency department patient was stood up in order and to assess capacity to perform activities and she syncopized.  Patient denies any chest pain, nausea, vomiting, headaches, focal weakness, fever, hematuria, dysuria, melena, hematochezia or any other complaints.    Clinical Impression  Patient demonstrates fair/poor sitting balance with frequent leaning/falling backwards, limited to a few unsteady labored steps at bedside with scissoring of legs resulting in loss of balance resulting in Mod/max assist to avoid falling, no drop in BP during supine to sit and sit to stands and patient tolerated sitting up in chair after therapy with nursing staff present in room.  Patient will benefit from continued physical therapy in hospital and recommended venue below to increase strength, balance, endurance for safe ADLs and gait.    Follow Up Recommendations SNF    Equipment Recommendations  None recommended by PT    Recommendations for Other Services       Precautions / Restrictions Precautions Precautions: Fall Restrictions Weight Bearing Restrictions: No      Mobility  Bed Mobility Overal bed mobility: Needs Assistance Bed Mobility: Supine to Sit;Sit to Supine     Supine to sit: Max assist Sit to supine: Max assist   General bed mobility comments: slow labored movement, Max verbal/tactile cueing  Transfers Overall transfer level: Needs assistance Equipment used: Rolling walker (2 wheeled) Transfers: Sit to/from [facility] Sit  to Stand: Mod assist Stand pivot transfers: Mod assist;Max assist       General transfer comment: unsteady on feet with loss of balance  Ambulation/Gait Ambulation/Gait assistance: Max assist Gait Distance (Feet): 3 Feet Assistive device: Rolling walker (2 wheeled) Gait Pattern/deviations: Decreased step length - right;Decreased step length - left;Decreased stride length;Scissoring Gait velocity: slow   General Gait Details: limited to 4-5 slow unsteady labored steps with scissoring of legs resulting in loss of balance  Stairs            Wheelchair Mobility    Modified Rankin (Stroke Patients Only)       Balance Overall balance assessment: Needs assistance Sitting-balance support: Feet supported;Bilateral upper extremity supported Sitting balance-Leahy Scale: Poor Sitting balance - Comments: fair/poor with frequent falling backwards   Standing balance support: During functional activity;Bilateral upper extremity supported Standing balance-Leahy Scale: Poor Standing balance comment: fair/poor using RW                             Pertinent Vitals/Pain Pain Assessment: Faces Faces Pain Scale: Hurts little more Pain Location: pressure to right side of rib cage Pain Descriptors / Indicators: Grimacing;Guarding;Sore Pain Intervention(s): Limited activity within patient's tolerance;Monitored during session    [place] expects to be discharged to:: Private residence Living Arrangements: Alone Available Help at Discharge: Personal care attendant Type of Home: House Home Access: Stairs to enter Entrance Stairs-Rails: Psychiatric nurse of Steps: 3-4 Home Layout: Two level;Able to live on main level with bedroom/bathroom Home Equipment: Gilford Rile - 2 wheels      Prior Function Level of Independence: Needs assistance   Gait / Transfers Assistance Needed:  household ambulator using RW  ADL's / Homemaking Assistance Needed:  home aide during the day, "per patient"        Hand Dominance        Extremity/Trunk Assessment   Upper Extremity Assessment Upper Extremity Assessment: Generalized weakness    Lower Extremity Assessment Lower Extremity Assessment: Generalized weakness    Cervical / Trunk Assessment Cervical / Trunk Assessment: Kyphotic  Communication   Communication: No difficulties  Cognition Arousal/Alertness: Awake/alert Behavior During Therapy: WFL for tasks assessed/performed;Anxious Overall Cognitive Status: Within Functional Limits for tasks assessed                                        General Comments      Exercises     Assessment/Plan    PT Assessment Patient needs continued PT services  PT Problem List Decreased strength;Decreased activity tolerance;Decreased balance;Decreased mobility       PT Treatment Interventions Gait training;Stair training;Functional mobility training;Therapeutic activities;Patient/family education;Therapeutic exercise    PT Goals (Current goals can be found in the Care Plan section)  Acute Rehab PT Goals Patient Stated Goal: return home Time For Goal Achievement: 07/09/18 Potential to Achieve Goals: Fair    Frequency Min 3X/week   Barriers to discharge        Co-evaluation               AM-PAC PT "6 Clicks" Mobility  Outcome Measure Help needed turning from your back to your side while in a flat bed without using bedrails?: A Lot Help needed moving from lying on your back to sitting on the side of a flat bed without using bedrails?: A Lot Help needed moving to and from a bed to a chair (including a wheelchair)?: A Lot Help needed standing up from a chair using your arms (e.g., wheelchair or bedside chair)?: A Lot Help needed to walk in hospital room?: Total Help needed climbing 3-5 steps with a railing? : Total 6 Click Score: 10    End of Session   Activity Tolerance: Patient tolerated treatment  well;Patient limited by fatigue Patient left: in chair;with call bell/phone within reach Nurse Communication: Mobility status PT Visit Diagnosis: Unsteadiness on feet (R26.81);Other abnormalities of gait and mobility (R26.89);Muscle weakness (generalized) (M62.81)    Time: 3086-5784 PT Time Calculation (min) (ACUTE ONLY): 28 min   Charges:   PT Evaluation $PT Eval Moderate Complexity: 1 Mod PT Treatments $Therapeutic Activity: 23-37 mins        12:28 PM, 06/25/18 Lonell Grandchild, MPT Physical Therapist with [facility] [phone] office [phone] mobile phone

## 2018-06-25 NOTE — Clinical Social Work Note (Signed)
[employer] Medicare auth started 06/25/2018.    Coltan Spinello, Clydene Pugh, LCSW

## 2018-06-26 MED ORDER — TOBRAMYCIN-DEXAMETHASONE 0.3-0.1 % OP SUSP
2.0000 [drp] | Freq: Four times a day (QID) | OPHTHALMIC | Status: DC
  Administered 2018-06-26 – 2018-06-27 (×4): 2 [drp] via OPHTHALMIC
  Filled 2018-06-26: qty 2.5

## 2018-06-26 NOTE — Care Management (Addendum)
Son has accepted bed offer at [facility]. [facility] made updated.  Ins Auth # [id number]. Updated son. Will arrange EMS transport upon DC.

## 2018-06-26 NOTE — Plan of Care (Signed)
  Problem: Health Behavior/Discharge Planning: Goal: Ability to manage health-related needs will improve Outcome: Progressing   Problem: Clinical Measurements: Goal: Will remain free from infection Outcome: Progressing Goal: Respiratory complications will improve Outcome: Progressing Goal: Cardiovascular complication will be avoided Outcome: Progressing   Problem: Nutrition: Goal: Adequate nutrition will be maintained Outcome: Progressing   Problem: Elimination: Goal: Will not experience complications related to bowel motility Outcome: Progressing   Problem: Pain Managment: Goal: General experience of comfort will improve Outcome: Progressing   Problem: Skin Integrity: Goal: Risk for impaired skin integrity will decrease Outcome: Progressing   Problem: Education: Goal: Knowledge of General Education information will improve Description Including pain rating scale, medication(s)/side effects and non-pharmacologic comfort measures Outcome: Progressing   Problem: Health Behavior/Discharge Planning: Goal: Ability to manage health-related needs will improve Outcome: Progressing   Problem: Clinical Measurements: Goal: Ability to maintain clinical measurements within normal limits will improve Outcome: Progressing Goal: Will remain free from infection Outcome: Progressing Goal: Diagnostic test results will improve Outcome: Progressing Goal: Respiratory complications will improve Outcome: Progressing Goal: Cardiovascular complication will be avoided Outcome: Progressing   Problem: Activity: Goal: Risk for activity intolerance will decrease Outcome: Progressing   Problem: Nutrition: Goal: Adequate nutrition will be maintained Outcome: Progressing   Problem: Coping: Goal: Level of anxiety will decrease Outcome: Progressing   Problem: Elimination: Goal: Will not experience complications related to bowel motility Outcome: Progressing Goal: Will not experience  complications related to urinary retention Outcome: Progressing   Problem: Pain Managment: Goal: General experience of comfort will improve Outcome: Progressing   Problem: Safety: Goal: Ability to remain free from injury will improve Outcome: Progressing   Problem: Skin Integrity: Goal: Risk for impaired skin integrity will decrease Outcome: Progressing

## 2018-06-26 NOTE — Clinical Social Work Placement (Signed)
   CLINICAL SOCIAL WORK PLACEMENT  NOTE  Date:  06/26/2018  Patient Details  Name: [name] MRN: [id number] Date of Birth: February 19, 1917  Clinical Social Work is seeking post-discharge placement for this patient at the [place] level of care (*CSW will initial, date and re-position this form in  chart as items are completed):  Yes   Patient/family provided with [place] Work Department's list of facilities offering this level of care within the geographic area requested by the patient (or if unable, by the patient's family).  Yes   Patient/family informed of their freedom to choose among providers that offer the needed level of care, that participate in Medicare, Medicaid or managed care program needed by the patient, have an available bed and are willing to accept the patient.  Yes   Patient/family informed of [place]'s ownership interest in [facility] and [facility], as well as of the fact that they are under no obligation to receive care at these facilities.  PASRR submitted to EDS on       PASRR number received on 06/25/18     Existing PASRR number confirmed on 06/25/18     FL2 transmitted to all facilities in geographic area requested by pt/family on 06/25/18     FL2 transmitted to all facilities within larger geographic area on 06/25/18     Patient informed that his/her managed care company has contracts with or will negotiate with certain facilities, including the following:        Yes   Patient/family informed of bed offers received.  Patient chooses bed at [facility]     Physician recommends and patient chooses bed at      Patient to be transferred to   on  .  Patient to be transferred to facility by       Patient family notified on   of transfer.  Name of family member notified:        PHYSICIAN       Additional Comment:    _______________________________________________ Nickola Major,  RN 06/26/2018, 2:03 PM

## 2018-06-26 NOTE — Progress Notes (Signed)
Physical Therapy Treatment Patient Details Name: [name] MRN: [id number] DOB: [birth year] Today's Date: 06/26/2018    History of Present Illness [name] is a 85 y.o. female with past medical history significant for arthritis, hypertension, chronic kidney disease stage III (baseline GFR) and Parkinson's disease; who presented to the emergency department secondary to multiple episodes of falls and generalized weakness.  While in the emergency department patient was stood up in order and to assess capacity to perform activities and she syncopized.  Patient denies any chest pain, nausea, vomiting, headaches, focal weakness, fever, hematuria, dysuria, melena, hematochezia or any other complaints.    PT Comments    Patient presents more alert and demonstrates improvement for following instructions and attempting functional activities.  Patient maintained sitting balance supporting self with BUE while performing BLE ROM/strengthening exercises, completed a few steps at bedside with less scissoring of legs, limited due mostly to weakness/fatigue and tolerated sitting up in chair with her family member present at bedside.  Patient will benefit from continued physical therapy in hospital and recommended venue below to increase strength, balance, endurance for safe ADLs and gait.   Follow Up Recommendations  SNF     Equipment Recommendations  None recommended by PT    Recommendations for Other Services       Precautions / Restrictions Precautions Precautions: Fall Restrictions Weight Bearing Restrictions: No    Mobility  Bed Mobility Overal bed mobility: Needs Assistance Bed Mobility: Supine to Sit     Supine to sit: Mod assist;Max assist     General bed mobility comments: slow labored movement, Mod verbal/tactile cueing  Transfers Overall transfer level: Needs assistance Equipment used: Rolling walker (2 wheeled) Transfers: Sit to/from [facility] Sit to  Stand: Mod assist Stand pivot transfers: Mod assist       General transfer comment: unsteady, loses balance during stand to sitting  Ambulation/Gait Ambulation/Gait assistance: Max assist Gait Distance (Feet): 4 Feet Assistive device: Rolling walker (2 wheeled)   Gait velocity: slow   General Gait Details: limited to 6-7 slow unsteady labored steps with slightly improvement for maintaining standing balance   Stairs             Wheelchair Mobility    Modified Rankin (Stroke Patients Only)       Balance Overall balance assessment: Needs assistance Sitting-balance support: Feet supported;Bilateral upper extremity supported Sitting balance-Leahy Scale: Fair Sitting balance - Comments: improvement for maintaining sitting balance, occasional leaning backwards Postural control: Posterior lean Standing balance support: During functional activity;Bilateral upper extremity supported Standing balance-Leahy Scale: Poor Standing balance comment: fair/poor using RW                            Cognition Arousal/Alertness: Awake/alert Behavior During Therapy: WFL for tasks assessed/performed;Anxious Overall Cognitive Status: Impaired/Different from baseline                                 General Comments: much improvement in cognitive status, more alert and follow directions consistently      Exercises General Exercises - Lower Extremity Long Arc Quad: Seated;Strengthening;AROM;Both;10 reps Hip Flexion/Marching: Seated;Strengthening;AROM;Both;10 reps Toe Raises: Seated;Strengthening;Both;10 reps;AROM Heel Raises: Seated;AROM;Strengthening;10 reps;Both    General Comments        Pertinent Vitals/Pain Pain Assessment: No/denies pain    Home Living  Prior Function            PT Goals (current goals can now be found in the care plan section) Acute Rehab PT Goals Patient Stated Goal: return home Time For Goal  Achievement: 07/09/18 Potential to Achieve Goals: Fair Progress towards PT goals: Progressing toward goals    Frequency    Min 3X/week      PT Plan Current plan remains appropriate    Co-evaluation              AM-PAC PT "6 Clicks" Mobility   Outcome Measure  Help needed turning from your back to your side while in a flat bed without using bedrails?: A Lot Help needed moving from lying on your back to sitting on the side of a flat bed without using bedrails?: A Lot Help needed moving to and from a bed to a chair (including a wheelchair)?: A Lot Help needed standing up from a chair using your arms (e.g., wheelchair or bedside chair)?: A Lot Help needed to walk in hospital room?: A Lot Help needed climbing 3-5 steps with a railing? : Total 6 Click Score: 11    End of Session Equipment Utilized During Treatment: Gait belt Activity Tolerance: Patient tolerated treatment well;Patient limited by fatigue Patient left: in chair;with call bell/phone within reach;with family/visitor present Nurse Communication: Mobility status PT Visit Diagnosis: Unsteadiness on feet (R26.81);Other abnormalities of gait and mobility (R26.89);Muscle weakness (generalized) (M62.81)     Time: 1747-1595 PT Time Calculation (min) (ACUTE ONLY): 27 min  Charges:  $Therapeutic Exercise: 8-22 mins $Therapeutic Activity: 8-22 mins                     2:47 PM, 06/26/18 Lonell Grandchild, MPT Physical Therapist with [facility] [phone] office [phone] mobile phone

## 2018-06-26 NOTE — Progress Notes (Signed)
Subjective: She is still confused this morning.  No new complaints.  Speech evaluation noted and appreciated.  PT evaluation noted and appreciated.  Help from chaplain noted and appreciated.  I called her son Mr. Josiah Lobo and we discussed her overall situation and her prognosis and he wants to go ahead with DNR status which I think is appropriate.  Objective: Vital signs in last 24 hours: Temp:  [97.9 F (36.6 C)-99.2 F (37.3 C)] 98.3 F (36.8 C) (03/10 0626) Pulse Rate:  [60-98] 98 (03/10 0626) Resp:  [15-18] 15 (03/10 0626) BP: (90-125)/(68-105) 114/68 (03/10 0626) SpO2:  [89 %-97 %] 96 % (03/10 0626) Weight:  [44 kg] 44 kg (03/10 0500) Weight change: 1 kg Last BM Date: 06/25/18  Intake/Output from previous day: 03/09 0701 - 03/10 0700 In: 655 [P.O.:120; I.V.:535] Out: 400 [Urine:400]  PHYSICAL EXAM General appearance: alert, no distress and Confused Resp: clear to auscultation bilaterally Cardio: regular rate and rhythm, S1, S2 normal, no murmur, click, rub or gallop GI: soft, non-tender; bowel sounds normal; no masses,  no organomegaly Extremities: extremities normal, atraumatic, no cyanosis or edema  Lab Results:  Results for orders placed or performed during the hospital encounter of 06/23/18 (from the past 48 hour(s))  Basic metabolic panel     Status: Abnormal   Collection Time: 06/25/18  4:35 AM  Result Value Ref Range   Sodium 147 (H) 135 - 145 mmol/L   Potassium 4.3 3.5 - 5.1 mmol/L    Comment: DELTA CHECK NOTED   Chloride 108 98 - 111 mmol/L   CO2 29 22 - 32 mmol/L   Glucose, Bld 101 (H) 70 - 99 mg/dL   BUN 20 8 - 23 mg/dL   Creatinine, Ser 0.96 0.44 - 1.00 mg/dL   Calcium 9.6 8.9 - 10.3 mg/dL   GFR calc non Af Amer 49 (L) >60 mL/min   GFR calc Af Amer 57 (L) >60 mL/min   Anion gap 10 5 - 15    Comment: Performed at [facility], [address], [place], [place] [address]  TSH     Status: None   Collection Time: 06/25/18  4:35 AM  Result Value Ref Range    TSH 1.214 0.350 - 4.500 uIU/mL    Comment: Performed by a 3rd Generation assay with a functional sensitivity of <=0.01 uIU/mL. Performed at [facility], [address] [place], [place] [address]   Vitamin B12     Status: None   Collection Time: 06/25/18  4:35 AM  Result Value Ref Range   Vitamin B-12 321 180 - 914 pg/mL    Comment: (NOTE) This assay is not validated for testing neonatal or myeloproliferative syndrome specimens for Vitamin B12 levels. Performed at [facility], [address] [place], [place] [address]   Cortisol     Status: None   Collection Time: 06/25/18  4:35 AM  Result Value Ref Range   Cortisol, Plasma 17.7 ug/dL    Comment: (NOTE) AM    6.7 - 22.6 ug/dL PM   <10.0       ug/dL Performed at [facility] [address] [place], [place] [address]   Magnesium     Status: None   Collection Time: 06/25/18  4:35 AM  Result Value Ref Range   Magnesium 2.1 1.7 - 2.4 mg/dL    Comment: Performed at [facility], [address], [place], [place] [address]  Phosphorus     Status: Abnormal   Collection Time: 06/25/18  4:35 AM  Result Value Ref Range   Phosphorus 2.3 (L) 2.5 - 4.6 mg/dL    Comment: Performed at [facility], [address], [place], [place] [address]    ABGS No results for input(s): PHART, PO2ART, TCO2, HCO3 in the last 72 hours.  Invalid input(s): PCO2 CULTURES No results found for this or any previous visit (from the past 240 hour(s)). Studies/Results: Ct Head Wo Contrast  Result Date: 06/24/2018 CLINICAL DATA:  Multiple falls, bruising to right forehead EXAM: CT HEAD WITHOUT CONTRAST TECHNIQUE: Contiguous axial images were obtained from the base of the skull through the vertex without intravenous contrast. COMPARISON:  11/22/2012 FINDINGS: Brain: No evidence of acute infarction, hemorrhage, hydrocephalus, extra-axial collection or mass lesion/mass effect. Periventricular white matter hypodensity and global volume loss. Vascular: No  hyperdense vessel or unexpected calcification. Skull: Normal. Negative for fracture or focal lesion. Sinuses/Orbits: No acute finding. Densely inspissated secretions in the left maxillary sinus as on prior examination. Other: None. IMPRESSION: No acute intracranial pathology. Small-vessel white matter disease and global volume loss. Electronically Signed   By: Eddie Candle M.D.   On: 06/24/2018 12:47    Medications:  Prior to Admission:  Medications Prior to Admission  Medication Sig Dispense Refill Last Dose  . carbidopa-levodopa (SINEMET) 25-100 MG per tablet Take 1 tablet by mouth 4 (four) times daily.     06/23/2018  . cetirizine (ZYRTEC) 10 MG tablet Take 10 mg by mouth at bedtime.    06/23/2018  . Cholecalciferol (VITAMIN D3) 2000 units TABS Take 2,000 Units by mouth at bedtime. 50 mcg   06/23/2018  . gabapentin (NEURONTIN) 100 MG capsule Take 100 mg by mouth at bedtime.  1 06/23/2018  . meclizine (ANTIVERT) 12.5 MG tablet Take 12.5 mg by mouth 4 (four) times daily as needed for dizziness.   unknown  . torsemide (DEMADEX) 20 MG tablet Take 20 mg by mouth daily at 2 PM. In the afternoon.  1 06/23/2018   Scheduled: . carbidopa-levodopa  1 tablet Oral QID  . cholecalciferol  2,000 Units Oral QHS  . gabapentin  100 mg Oral QHS  . loratadine  10 mg Oral Daily   Continuous:  JYN:[id number], ondansetron **OR** ondansetron (ZOFRAN) IV, traMADol  Assesment: She was admitted with syncope.  I think she was dehydrated.  She has severe physical deconditioning.  Blood work looks like she has improved renal function and dehydration is better.  At baseline she has Parkinson's disease.  She had been alert but has not been alert since her admission.  She had acute on chronic renal failure I think associated with dehydration  She has hypertension which is well controlled Principal Problem:   Syncope and collapse Active Problems:   Hypertension   Parkinson disease ([place])   Physical deconditioning    Acute renal failure superimposed on stage 3 chronic kidney disease (HCC)   Hypokalemia    Plan: Continue treatments.  It has been recommended that she go to skilled care facility and I think that is appropriate.  She is appropriate for DNR status after discussion with her son and I have written that order    LOS: 1 day   Alonza Bogus 06/26/2018, 8:28 AM

## 2018-06-26 NOTE — Clinical Social Work Note (Signed)
Clinical Social Work Assessment  Patient Details  Name: [name] MRN: [id number] Date of Birth: [birth year]  Date of referral:                  Reason for consult:                   Permission sought to share information with:    Permission granted to share information::     Name::        Agency::     Relationship::     Contact Information:     Housing/Transportation Living arrangements for the past 2 months:  Single Family Home Source of Information:  Other (Comment Required)(son) Patient Interpreter Needed:  None Criminal Activity/Legal Involvement Pertinent to Current Situation/Hospitalization:  No - Comment as needed Significant Relationships:  Adult Children Lives with:  Self(aides through [facility]) Do you feel safe going back to the place where you live?    Need for family participation in patient care:  Yes (Comment)  Care giving concerns:  Son had plan for patient to go to [facility] this Monday, but now with recent falls this past weekend patient came to ED, is hospitalized and will need SNF.    Social Worker assessment / plan:  Patient is from, has caregivers during the day, alone at night. Walks with RW. Son very involved.   Employment status:   reitred Nurse, adult, Medicaid [place] PT Recommendations:  [place] / Referral to community resources:  [place]  Patient/Family's Response to care: Son very engaged, has been working on long term plan for patient prior to hospitalization.   Patient/Family's Understanding of and Emotional Response to Diagnosis, Current Treatment, and Prognosis:  Patient and son agreeable to SNF placement. Has spoken with attending, patient is now DNR.   Emotional Assessment Appearance:  Appears stated age Attitude/Demeanor/Rapport:    Affect (typically observed):  Calm Orientation:  Oriented to Self, Oriented to Place Alcohol / Substance use:    Psych  involvement (Current and /or in the community):  No (Comment)  Discharge Needs  Concerns to be addressed:  No discharge needs identified Readmission within the last 30 days:  No Current discharge risk:    Barriers to Discharge:  No Barriers Identified(son has talked to [facility] already about LTC needs)   Young Brim, Chauncey [place], RN 06/26/2018, 1:54 PM

## 2018-06-27 DIAGNOSIS — E860 Dehydration: Secondary | ICD-10-CM | POA: Diagnosis not present

## 2018-06-27 DIAGNOSIS — G20 Parkinson's disease: Secondary | ICD-10-CM | POA: Diagnosis not present

## 2018-06-27 DIAGNOSIS — E559 Vitamin D deficiency, unspecified: Secondary | ICD-10-CM | POA: Diagnosis not present

## 2018-06-27 DIAGNOSIS — R1310 Dysphagia, unspecified: Secondary | ICD-10-CM | POA: Diagnosis not present

## 2018-06-27 DIAGNOSIS — N183 Chronic kidney disease, stage 3 (moderate): Secondary | ICD-10-CM | POA: Diagnosis not present

## 2018-06-27 DIAGNOSIS — R55 Syncope and collapse: Secondary | ICD-10-CM | POA: Diagnosis not present

## 2018-06-27 DIAGNOSIS — R627 Adult failure to thrive: Secondary | ICD-10-CM | POA: Diagnosis not present

## 2018-06-27 DIAGNOSIS — Z7401 Bed confinement status: Secondary | ICD-10-CM | POA: Diagnosis not present

## 2018-06-27 DIAGNOSIS — I10 Essential (primary) hypertension: Secondary | ICD-10-CM | POA: Diagnosis not present

## 2018-06-27 DIAGNOSIS — R41841 Cognitive communication deficit: Secondary | ICD-10-CM | POA: Diagnosis not present

## 2018-06-27 DIAGNOSIS — Z9181 History of falling: Secondary | ICD-10-CM | POA: Diagnosis not present

## 2018-06-27 DIAGNOSIS — M6281 Muscle weakness (generalized): Secondary | ICD-10-CM | POA: Diagnosis not present

## 2018-06-27 DIAGNOSIS — R278 Other lack of coordination: Secondary | ICD-10-CM | POA: Diagnosis not present

## 2018-06-27 DIAGNOSIS — I129 Hypertensive chronic kidney disease with stage 1 through stage 4 chronic kidney disease, or unspecified chronic kidney disease: Secondary | ICD-10-CM | POA: Diagnosis not present

## 2018-06-27 DIAGNOSIS — R262 Difficulty in walking, not elsewhere classified: Secondary | ICD-10-CM | POA: Diagnosis not present

## 2018-06-27 MED ORDER — ONDANSETRON HCL 4 MG PO TABS: 4.0000 mg | ORAL_TABLET | Freq: Four times a day (QID) | ORAL | 0 refills | Status: DC | PRN

## 2018-06-27 MED ORDER — ACETAMINOPHEN 325 MG PO TABS: 650.0000 mg | ORAL_TABLET | Freq: Four times a day (QID) | ORAL | 1 refills | Status: DC | PRN

## 2018-06-27 MED ORDER — TOBRAMYCIN-DEXAMETHASONE 0.3-0.1 % OP SUSP: 2.0000 [drp] | Freq: Four times a day (QID) | OPHTHALMIC | 0 refills | Status: DC

## 2018-06-27 MED ORDER — TRAMADOL HCL 50 MG PO TABS: 25.0000 mg | ORAL_TABLET | Freq: Two times a day (BID) | ORAL | 0 refills | Status: AC

## 2018-06-27 NOTE — Care Management (Signed)
Insurance authorization # for SNF is [id number].

## 2018-06-27 NOTE — Care Management Important Message (Signed)
Important Message  Patient Details  Name: [name] MRN: [id number] Date of Birth: June 01, 1916   Medicare Important Message Given:  Yes    Tommy Medal 06/27/2018, 1:15 PM

## 2018-06-27 NOTE — Discharge Summary (Signed)
Physician Discharge Summary  Patient ID: [name] MRN: [id number] DOB/AGE: 18-Sep-1916 85 y.o. Primary Care Physician:Vester Titsworth, Percell Miller, MD Admit date: 06/23/2018 Discharge date: 06/27/2018    Discharge Diagnoses:   Principal Problem:   Syncope and collapse Active Problems:   Hypertension   Parkinson disease ([place])   Physical deconditioning   Acute renal failure superimposed on stage 3 chronic kidney disease (HCC)   Hypokalemia Dehydration Acute metabolic encephalopathy  Allergies as of 06/27/2018   No Known Allergies     Medication List    STOP taking these medications   torsemide 20 MG tablet Commonly known as:  DEMADEX     TAKE these medications   acetaminophen 325 MG tablet Commonly known as:  TYLENOL Take 2 tablets (650 mg total) by mouth every 6 (six) hours as needed for mild pain, fever or headache.   carbidopa-levodopa 25-100 MG tablet Commonly known as:  SINEMET IR Take 1 tablet by mouth 4 (four) times daily.   cetirizine 10 MG tablet Commonly known as:  ZYRTEC Take 10 mg by mouth at bedtime.   gabapentin 100 MG capsule Commonly known as:  NEURONTIN Take 100 mg by mouth at bedtime.   meclizine 12.5 MG tablet Commonly known as:  ANTIVERT Take 12.5 mg by mouth 4 (four) times daily as needed for dizziness.   ondansetron 4 MG tablet Commonly known as:  ZOFRAN Take 1 tablet (4 mg total) by mouth every 6 (six) hours as needed for nausea.   tobramycin-dexamethasone ophthalmic solution Commonly known as:  TOBRADEX Place 2 drops into the left eye every 6 (six) hours. For 5 more days   traMADol 50 MG tablet Commonly known as:  Ultram Take 0.5 tablets (25 mg total) by mouth 2 (two) times daily. As needed for moderate to severe pain   Vitamin D3 50 MCG (2000 UT) Tabs Take 2,000 Units by mouth at bedtime. 50 mcg       Discharged Condition: Improved    Consults: None  Significant Diagnostic Studies: Dg Ribs Unilateral Right  Result Date:  06/23/2018 CLINICAL DATA:  Pain after multiple falls over the past few days. EXAM: RIGHT RIBS - 2 VIEW COMPARISON:  CXR 04/09/2011 and chest CT 06/03/2003 FINDINGS: Multiple remote appearing right-sided rib fractures are identified involving the right lateral third, fourth, eighth, ninth and tenth ribs. No acute displaced rib fracture is seen. No pulmonary consolidation, effusion or pneumothorax. IMPRESSION: Multiple right-sided remote appearing rib fractures. No acute displaced rib fracture is identified. If there is pain out of proportion to radiographic findings, follow-up radiographs in 7-10 days may help reveal additional occult fractures. Electronically Signed   By: Ashley Royalty M.D.   On: 06/23/2018 22:32   Dg Pelvis 1-2 Views  Result Date: 06/23/2018 CLINICAL DATA:  Multiple falls over the past few days. Right hip pain. EXAM: PELVIS - 1-2 VIEW COMPARISON:  CT 06/03/2003 FINDINGS: Bilateral facet hypertrophy and sclerosis at L5-S1. No pelvic diastasis or acute fracture. Injection granulomata project over the left iliac bone. Mild protrusio deformity of the left native hip, likely degenerative in etiology. The included right hip demonstrates a partially included right femoral nail without complicating features. No acute proximal femoral fracture is identified. IMPRESSION: 1. Lower lumbar facet arthropathy. 2. No acute pelvic or proximal femoral fracture is noted. Electronically Signed   By: Ashley Royalty M.D.   On: 06/23/2018 22:40   Ct Head Wo Contrast  Result Date: 06/24/2018 CLINICAL DATA:  Multiple falls, bruising to right forehead EXAM: CT  HEAD WITHOUT CONTRAST TECHNIQUE: Contiguous axial images were obtained from the base of the skull through the vertex without intravenous contrast. COMPARISON:  11/22/2012 FINDINGS: Brain: No evidence of acute infarction, hemorrhage, hydrocephalus, extra-axial collection or mass lesion/mass effect. Periventricular white matter hypodensity and global volume loss.  Vascular: No hyperdense vessel or unexpected calcification. Skull: Normal. Negative for fracture or focal lesion. Sinuses/Orbits: No acute finding. Densely inspissated secretions in the left maxillary sinus as on prior examination. Other: None. IMPRESSION: No acute intracranial pathology. Small-vessel white matter disease and global volume loss. Electronically Signed   By: Eddie Candle M.D.   On: 06/24/2018 12:47    Lab Results: Basic Metabolic Panel: Recent Labs    06/25/18 0435  NA 147*  K 4.3  CL 108  CO2 29  GLUCOSE 101*  BUN 20  CREATININE 0.96  CALCIUM 9.6  MG 2.1  PHOS 2.3*   Liver Function Tests: No results for input(s): AST, ALT, ALKPHOS, BILITOT, PROT, ALBUMIN in the last 72 hours.   CBC: No results for input(s): WBC, NEUTROABS, HGB, HCT, MCV, PLT in the last 72 hours.  No results found for this or any previous visit (from the past 240 hour(s)).   Hospital Course: This is a 85 year old who came to the emergency department because she fell at home.  She had not been steady on her feet and has been having a lot more problems with falls recently.  She did have a medication added for her Parkinson's disease which may have something to do with this but is not totally clear.  She has not been eating or drinking.  She was found to have acute on chronic renal failure on admission and her potassium was low.  She was given IV fluids with potassium and her renal function improved to baseline.  Her potassium improved to normal.  She was felt to be dehydrated on admission and that was treated with fluids and she was much better.  She had PT consultation and it was felt that she needed skilled care facility for rehab and that is been arranged.  She had speech evaluation and it was felt that she should be on a dysphagia 2 diet.  By the time of discharge she was back basically to baseline.  She had been confused on admission but her confusion was much improved.  Discharge Exam: Blood  pressure (!) 162/89, pulse (!) 103, temperature (!) 97.5 F (36.4 C), temperature source Oral, resp. rate 18, height 4\' 10"  (1.473 m), weight 44.2 kg, SpO2 95 %. More awake and alert.  Chest is clear.  Heart is regular.  Disposition: To skilled care facility.  Time spent on this discharge activity greater than 30 minutes   Contact information for after-discharge care    Destination    [facility] .   Service:  Skilled Nursing Contact information: [address] [place] [phone]              Signed: Alonza Bogus   06/27/2018, 8:53 AM

## 2018-06-27 NOTE — Care Management (Signed)
EMS called for transport to [facility] in [place]. Elyse Hsu of [facility] notified. Jim-patient's son- notified of DC. He plans to meet patient there.  Bedside RN updated- report to be called to [phone]. Patient will be in Room 12.

## 2018-06-27 NOTE — Plan of Care (Signed)
  Problem: Health Behavior/Discharge Planning: Goal: Ability to manage health-related needs will improve Outcome: Adequate for Discharge   Problem: Clinical Measurements: Goal: Will remain free from infection Outcome: Adequate for Discharge Goal: Respiratory complications will improve Outcome: Adequate for Discharge Goal: Cardiovascular complication will be avoided Outcome: Adequate for Discharge   Problem: Nutrition: Goal: Adequate nutrition will be maintained Outcome: Adequate for Discharge   Problem: Elimination: Goal: Will not experience complications related to bowel motility Outcome: Adequate for Discharge   Problem: Pain Managment: Goal: General experience of comfort will improve Outcome: Adequate for Discharge   Problem: Skin Integrity: Goal: Risk for impaired skin integrity will decrease Outcome: Adequate for Discharge   Problem: Education: Goal: Knowledge of General Education information will improve Description Including pain rating scale, medication(s)/side effects and non-pharmacologic comfort measures Outcome: Adequate for Discharge   Problem: Health Behavior/Discharge Planning: Goal: Ability to manage health-related needs will improve Outcome: Adequate for Discharge   Problem: Clinical Measurements: Goal: Ability to maintain clinical measurements within normal limits will improve Outcome: Adequate for Discharge Goal: Will remain free from infection Outcome: Adequate for Discharge Goal: Diagnostic test results will improve Outcome: Adequate for Discharge Goal: Respiratory complications will improve Outcome: Adequate for Discharge Goal: Cardiovascular complication will be avoided Outcome: Adequate for Discharge   Problem: Activity: Goal: Risk for activity intolerance will decrease Outcome: Adequate for Discharge   Problem: Nutrition: Goal: Adequate nutrition will be maintained Outcome: Adequate for Discharge   Problem: Coping: Goal: Level of  anxiety will decrease Outcome: Adequate for Discharge   Problem: Elimination: Goal: Will not experience complications related to bowel motility Outcome: Adequate for Discharge Goal: Will not experience complications related to urinary retention Outcome: Adequate for Discharge   Problem: Pain Managment: Goal: General experience of comfort will improve Outcome: Adequate for Discharge   Problem: Safety: Goal: Ability to remain free from injury will improve Outcome: Adequate for Discharge   Problem: Skin Integrity: Goal: Risk for impaired skin integrity will decrease Outcome: Adequate for Discharge

## 2018-06-27 NOTE — Progress Notes (Signed)
Report called to [facility] at this time

## 2018-06-27 NOTE — Progress Notes (Signed)
Subjective: She is sleeping but arousable.  More oriented today.  Certainly more alert.  She has no particular complaints  Objective: Vital signs in last 24 hours: Temp:  [97.5 F (36.4 C)-98.2 F (36.8 C)] 97.5 F (36.4 C) (03/11 0523) Pulse Rate:  [59-103] 103 (03/11 0532) Resp:  [16-18] 18 (03/11 0523) BP: (123-162)/(65-89) 162/89 (03/11 0523) SpO2:  [75 %-100 %] 95 % (03/11 0545) Weight:  [44.2 kg] 44.2 kg (03/11 0440) Weight change: 0.2 kg Last BM Date: 06/25/18  Intake/Output from previous day: 03/10 0701 - 03/11 0700 In: 120 [P.O.:120] Out: 800 [Urine:800]  PHYSICAL EXAM General appearance: alert, cooperative and no distress Resp: clear to auscultation bilaterally Cardio: regular rate and rhythm, S1, S2 normal, no murmur, click, rub or gallop GI: soft, non-tender; bowel sounds normal; no masses,  no organomegaly Extremities: extremities normal, atraumatic, no cyanosis or edema  Lab Results:  No results found for this or any previous visit (from the past 48 hour(s)).  ABGS No results for input(s): PHART, PO2ART, TCO2, HCO3 in the last 72 hours.  Invalid input(s): PCO2 CULTURES No results found for this or any previous visit (from the past 240 hour(s)). Studies/Results: No results found.  Medications:  Prior to Admission:  Medications Prior to Admission  Medication Sig Dispense Refill Last Dose  . carbidopa-levodopa (SINEMET) 25-100 MG per tablet Take 1 tablet by mouth 4 (four) times daily.     06/23/2018  . cetirizine (ZYRTEC) 10 MG tablet Take 10 mg by mouth at bedtime.    06/23/2018  . Cholecalciferol (VITAMIN D3) 2000 units TABS Take 2,000 Units by mouth at bedtime. 50 mcg   06/23/2018  . gabapentin (NEURONTIN) 100 MG capsule Take 100 mg by mouth at bedtime.  1 06/23/2018  . meclizine (ANTIVERT) 12.5 MG tablet Take 12.5 mg by mouth 4 (four) times daily as needed for dizziness.   unknown  . torsemide (DEMADEX) 20 MG tablet Take 20 mg by mouth daily at 2 PM. In the  afternoon.  1 06/23/2018   Scheduled: . carbidopa-levodopa  1 tablet Oral QID  . cholecalciferol  2,000 Units Oral QHS  . gabapentin  100 mg Oral QHS  . loratadine  10 mg Oral Daily  . tobramycin-dexamethasone  2 drop Left Eye Q6H   Continuous:  OVF:IEPPIRJJOACZY, ondansetron **OR** ondansetron (ZOFRAN) IV, traMADol  Assesment: She was admitted with syncope.  She was very confused on admission probably multifactorial.  I believe this was metabolic encephalopathy likely related to dehydration.  She had acute renal failure superimposed on stage III chronic kidney disease but her renal function has returned to baseline with rehydration  She was hypokalemic but potassium greater than 4 after replacement  She has hypertension which is well controlled  She has Parkinson's disease at baseline and she is on medication.  There is some concern that perhaps a new medication that was added because of increased symptoms may have caused some of her problem  She is deconditioned and plan is for her to go to skilled care facility and she has bed available today Principal Problem:   Syncope and collapse Active Problems:   Hypertension   Parkinson disease ([place])   Physical deconditioning   Acute renal failure superimposed on stage 3 chronic kidney disease ([place])   Hypokalemia    Plan: Transfer to skilled care facility    LOS: 2 days   Alonza Bogus 06/27/2018, 8:00 AM

## 2018-07-03 DIAGNOSIS — E559 Vitamin D deficiency, unspecified: Secondary | ICD-10-CM | POA: Diagnosis not present

## 2018-07-03 DIAGNOSIS — M6281 Muscle weakness (generalized): Secondary | ICD-10-CM | POA: Diagnosis not present

## 2018-07-03 DIAGNOSIS — G20 Parkinson's disease: Secondary | ICD-10-CM | POA: Diagnosis not present

## 2018-07-03 DIAGNOSIS — N183 Chronic kidney disease, stage 3 (moderate): Secondary | ICD-10-CM | POA: Diagnosis not present

## 2018-07-05 DIAGNOSIS — G20 Parkinson's disease: Secondary | ICD-10-CM | POA: Diagnosis not present

## 2018-07-05 DIAGNOSIS — M6281 Muscle weakness (generalized): Secondary | ICD-10-CM | POA: Diagnosis not present

## 2018-07-05 DIAGNOSIS — R1310 Dysphagia, unspecified: Secondary | ICD-10-CM | POA: Diagnosis not present

## 2018-07-05 DIAGNOSIS — N183 Chronic kidney disease, stage 3 (moderate): Secondary | ICD-10-CM | POA: Diagnosis not present

## 2018-07-06 DIAGNOSIS — G20 Parkinson's disease: Secondary | ICD-10-CM | POA: Diagnosis not present

## 2018-07-06 DIAGNOSIS — N183 Chronic kidney disease, stage 3 (moderate): Secondary | ICD-10-CM | POA: Diagnosis not present

## 2018-07-06 DIAGNOSIS — M6281 Muscle weakness (generalized): Secondary | ICD-10-CM | POA: Diagnosis not present

## 2018-07-06 DIAGNOSIS — R1310 Dysphagia, unspecified: Secondary | ICD-10-CM | POA: Diagnosis not present

## 2018-07-13 DIAGNOSIS — G20 Parkinson's disease: Secondary | ICD-10-CM | POA: Diagnosis not present

## 2018-07-13 DIAGNOSIS — N183 Chronic kidney disease, stage 3 (moderate): Secondary | ICD-10-CM | POA: Diagnosis not present

## 2018-07-13 DIAGNOSIS — M6281 Muscle weakness (generalized): Secondary | ICD-10-CM | POA: Diagnosis not present

## 2018-07-13 DIAGNOSIS — R1310 Dysphagia, unspecified: Secondary | ICD-10-CM | POA: Diagnosis not present

## 2018-07-20 DIAGNOSIS — I129 Hypertensive chronic kidney disease with stage 1 through stage 4 chronic kidney disease, or unspecified chronic kidney disease: Secondary | ICD-10-CM | POA: Diagnosis not present

## 2018-07-20 DIAGNOSIS — N183 Chronic kidney disease, stage 3 (moderate): Secondary | ICD-10-CM | POA: Diagnosis not present

## 2018-07-20 DIAGNOSIS — G20 Parkinson's disease: Secondary | ICD-10-CM | POA: Diagnosis not present

## 2018-07-20 DIAGNOSIS — D649 Anemia, unspecified: Secondary | ICD-10-CM | POA: Diagnosis not present

## 2018-07-20 DIAGNOSIS — M6281 Muscle weakness (generalized): Secondary | ICD-10-CM | POA: Diagnosis not present

## 2018-07-20 DIAGNOSIS — R1310 Dysphagia, unspecified: Secondary | ICD-10-CM | POA: Diagnosis not present

## 2018-07-26 DIAGNOSIS — M6281 Muscle weakness (generalized): Secondary | ICD-10-CM | POA: Diagnosis not present

## 2018-07-26 DIAGNOSIS — G20 Parkinson's disease: Secondary | ICD-10-CM | POA: Diagnosis not present

## 2018-07-26 DIAGNOSIS — E559 Vitamin D deficiency, unspecified: Secondary | ICD-10-CM | POA: Diagnosis not present

## 2018-07-26 DIAGNOSIS — N183 Chronic kidney disease, stage 3 (moderate): Secondary | ICD-10-CM | POA: Diagnosis not present

## 2018-07-28 DIAGNOSIS — N390 Urinary tract infection, site not specified: Secondary | ICD-10-CM | POA: Diagnosis not present

## 2018-07-28 DIAGNOSIS — R319 Hematuria, unspecified: Secondary | ICD-10-CM | POA: Diagnosis not present

## 2018-08-02 DIAGNOSIS — M6281 Muscle weakness (generalized): Secondary | ICD-10-CM | POA: Diagnosis not present

## 2018-08-02 DIAGNOSIS — N183 Chronic kidney disease, stage 3 (moderate): Secondary | ICD-10-CM | POA: Diagnosis not present

## 2018-08-02 DIAGNOSIS — R1310 Dysphagia, unspecified: Secondary | ICD-10-CM | POA: Diagnosis not present

## 2018-08-02 DIAGNOSIS — G20 Parkinson's disease: Secondary | ICD-10-CM | POA: Diagnosis not present

## 2018-08-07 DIAGNOSIS — N183 Chronic kidney disease, stage 3 (moderate): Secondary | ICD-10-CM | POA: Diagnosis not present

## 2018-08-07 DIAGNOSIS — G20 Parkinson's disease: Secondary | ICD-10-CM | POA: Diagnosis not present

## 2018-08-07 DIAGNOSIS — R1310 Dysphagia, unspecified: Secondary | ICD-10-CM | POA: Diagnosis not present

## 2018-08-07 DIAGNOSIS — M6281 Muscle weakness (generalized): Secondary | ICD-10-CM | POA: Diagnosis not present

## 2018-08-24 DIAGNOSIS — N183 Chronic kidney disease, stage 3 (moderate): Secondary | ICD-10-CM | POA: Diagnosis not present

## 2018-08-24 DIAGNOSIS — R1310 Dysphagia, unspecified: Secondary | ICD-10-CM | POA: Diagnosis not present

## 2018-08-24 DIAGNOSIS — G20 Parkinson's disease: Secondary | ICD-10-CM | POA: Diagnosis not present

## 2018-08-24 DIAGNOSIS — M6281 Muscle weakness (generalized): Secondary | ICD-10-CM | POA: Diagnosis not present

## 2018-08-30 DIAGNOSIS — M6281 Muscle weakness (generalized): Secondary | ICD-10-CM | POA: Diagnosis not present

## 2018-08-30 DIAGNOSIS — N183 Chronic kidney disease, stage 3 (moderate): Secondary | ICD-10-CM | POA: Diagnosis not present

## 2018-08-30 DIAGNOSIS — G20 Parkinson's disease: Secondary | ICD-10-CM | POA: Diagnosis not present

## 2018-08-30 DIAGNOSIS — R1310 Dysphagia, unspecified: Secondary | ICD-10-CM | POA: Diagnosis not present

## 2018-09-27 DIAGNOSIS — N183 Chronic kidney disease, stage 3 (moderate): Secondary | ICD-10-CM | POA: Diagnosis not present

## 2018-09-27 DIAGNOSIS — E559 Vitamin D deficiency, unspecified: Secondary | ICD-10-CM | POA: Diagnosis not present

## 2018-09-27 DIAGNOSIS — M6281 Muscle weakness (generalized): Secondary | ICD-10-CM | POA: Diagnosis not present

## 2018-09-27 DIAGNOSIS — G8929 Other chronic pain: Secondary | ICD-10-CM | POA: Diagnosis not present

## 2018-10-15 DIAGNOSIS — R1310 Dysphagia, unspecified: Secondary | ICD-10-CM | POA: Diagnosis not present

## 2018-10-15 DIAGNOSIS — K5900 Constipation, unspecified: Secondary | ICD-10-CM | POA: Diagnosis not present

## 2018-10-15 DIAGNOSIS — N183 Chronic kidney disease, stage 3 (moderate): Secondary | ICD-10-CM | POA: Diagnosis not present

## 2018-10-15 DIAGNOSIS — M6281 Muscle weakness (generalized): Secondary | ICD-10-CM | POA: Diagnosis not present

## 2018-10-25 DIAGNOSIS — N183 Chronic kidney disease, stage 3 (moderate): Secondary | ICD-10-CM | POA: Diagnosis not present

## 2018-10-25 DIAGNOSIS — R1310 Dysphagia, unspecified: Secondary | ICD-10-CM | POA: Diagnosis not present

## 2018-10-25 DIAGNOSIS — G20 Parkinson's disease: Secondary | ICD-10-CM | POA: Diagnosis not present

## 2018-10-25 DIAGNOSIS — M6281 Muscle weakness (generalized): Secondary | ICD-10-CM | POA: Diagnosis not present

## 2018-11-01 DIAGNOSIS — H6120 Impacted cerumen, unspecified ear: Secondary | ICD-10-CM | POA: Diagnosis not present

## 2018-11-01 DIAGNOSIS — H6691 Otitis media, unspecified, right ear: Secondary | ICD-10-CM | POA: Diagnosis not present

## 2018-11-06 DIAGNOSIS — U071 COVID-19: Secondary | ICD-10-CM | POA: Diagnosis not present

## 2018-11-07 DIAGNOSIS — H6691 Otitis media, unspecified, right ear: Secondary | ICD-10-CM | POA: Diagnosis not present

## 2018-11-07 DIAGNOSIS — H7290 Unspecified perforation of tympanic membrane, unspecified ear: Secondary | ICD-10-CM | POA: Diagnosis not present

## 2018-11-07 DIAGNOSIS — N183 Chronic kidney disease, stage 3 (moderate): Secondary | ICD-10-CM | POA: Diagnosis not present

## 2018-11-07 DIAGNOSIS — M6281 Muscle weakness (generalized): Secondary | ICD-10-CM | POA: Diagnosis not present

## 2018-11-19 DIAGNOSIS — M6281 Muscle weakness (generalized): Secondary | ICD-10-CM | POA: Diagnosis not present

## 2018-11-20 DIAGNOSIS — M6281 Muscle weakness (generalized): Secondary | ICD-10-CM | POA: Diagnosis not present

## 2018-11-21 DIAGNOSIS — M6281 Muscle weakness (generalized): Secondary | ICD-10-CM | POA: Diagnosis not present

## 2018-11-22 DIAGNOSIS — H6691 Otitis media, unspecified, right ear: Secondary | ICD-10-CM | POA: Diagnosis not present

## 2018-11-22 DIAGNOSIS — R55 Syncope and collapse: Secondary | ICD-10-CM | POA: Diagnosis not present

## 2018-11-22 DIAGNOSIS — G20 Parkinson's disease: Secondary | ICD-10-CM | POA: Diagnosis not present

## 2018-11-22 DIAGNOSIS — H7290 Unspecified perforation of tympanic membrane, unspecified ear: Secondary | ICD-10-CM | POA: Diagnosis not present

## 2018-11-22 DIAGNOSIS — M6281 Muscle weakness (generalized): Secondary | ICD-10-CM | POA: Diagnosis not present

## 2018-11-23 DIAGNOSIS — M6281 Muscle weakness (generalized): Secondary | ICD-10-CM | POA: Diagnosis not present

## 2018-11-26 DIAGNOSIS — M6281 Muscle weakness (generalized): Secondary | ICD-10-CM | POA: Diagnosis not present

## 2018-11-27 DIAGNOSIS — M6281 Muscle weakness (generalized): Secondary | ICD-10-CM | POA: Diagnosis not present

## 2018-11-28 DIAGNOSIS — M6281 Muscle weakness (generalized): Secondary | ICD-10-CM | POA: Diagnosis not present

## 2018-11-29 DIAGNOSIS — M6281 Muscle weakness (generalized): Secondary | ICD-10-CM | POA: Diagnosis not present

## 2018-12-02 DIAGNOSIS — M6281 Muscle weakness (generalized): Secondary | ICD-10-CM | POA: Diagnosis not present

## 2018-12-03 DIAGNOSIS — M6281 Muscle weakness (generalized): Secondary | ICD-10-CM | POA: Diagnosis not present

## 2018-12-04 DIAGNOSIS — M6281 Muscle weakness (generalized): Secondary | ICD-10-CM | POA: Diagnosis not present

## 2018-12-05 DIAGNOSIS — M6281 Muscle weakness (generalized): Secondary | ICD-10-CM | POA: Diagnosis not present

## 2018-12-06 DIAGNOSIS — M6281 Muscle weakness (generalized): Secondary | ICD-10-CM | POA: Diagnosis not present

## 2018-12-09 DIAGNOSIS — M6281 Muscle weakness (generalized): Secondary | ICD-10-CM | POA: Diagnosis not present

## 2018-12-10 DIAGNOSIS — G20 Parkinson's disease: Secondary | ICD-10-CM | POA: Diagnosis not present

## 2018-12-10 DIAGNOSIS — N183 Chronic kidney disease, stage 3 (moderate): Secondary | ICD-10-CM | POA: Diagnosis not present

## 2018-12-10 DIAGNOSIS — M6281 Muscle weakness (generalized): Secondary | ICD-10-CM | POA: Diagnosis not present

## 2018-12-10 DIAGNOSIS — I10 Essential (primary) hypertension: Secondary | ICD-10-CM | POA: Diagnosis not present

## 2018-12-11 DIAGNOSIS — M6281 Muscle weakness (generalized): Secondary | ICD-10-CM | POA: Diagnosis not present

## 2018-12-12 DIAGNOSIS — M6281 Muscle weakness (generalized): Secondary | ICD-10-CM | POA: Diagnosis not present

## 2018-12-17 DIAGNOSIS — M6281 Muscle weakness (generalized): Secondary | ICD-10-CM | POA: Diagnosis not present

## 2018-12-20 DIAGNOSIS — R1310 Dysphagia, unspecified: Secondary | ICD-10-CM | POA: Diagnosis not present

## 2018-12-20 DIAGNOSIS — G20 Parkinson's disease: Secondary | ICD-10-CM | POA: Diagnosis not present

## 2018-12-20 DIAGNOSIS — M6281 Muscle weakness (generalized): Secondary | ICD-10-CM | POA: Diagnosis not present

## 2018-12-20 DIAGNOSIS — N183 Chronic kidney disease, stage 3 (moderate): Secondary | ICD-10-CM | POA: Diagnosis not present

## 2019-01-07 DIAGNOSIS — U071 COVID-19: Secondary | ICD-10-CM | POA: Diagnosis not present

## 2019-01-16 DIAGNOSIS — R1310 Dysphagia, unspecified: Secondary | ICD-10-CM | POA: Diagnosis not present

## 2019-01-16 DIAGNOSIS — M6281 Muscle weakness (generalized): Secondary | ICD-10-CM | POA: Diagnosis not present

## 2019-01-16 DIAGNOSIS — N183 Chronic kidney disease, stage 3 (moderate): Secondary | ICD-10-CM | POA: Diagnosis not present

## 2019-01-16 DIAGNOSIS — E559 Vitamin D deficiency, unspecified: Secondary | ICD-10-CM | POA: Diagnosis not present

## 2019-01-24 DIAGNOSIS — G20 Parkinson's disease: Secondary | ICD-10-CM | POA: Diagnosis not present

## 2019-01-24 DIAGNOSIS — M6281 Muscle weakness (generalized): Secondary | ICD-10-CM | POA: Diagnosis not present

## 2019-01-24 DIAGNOSIS — G8929 Other chronic pain: Secondary | ICD-10-CM | POA: Diagnosis not present

## 2019-01-24 DIAGNOSIS — R55 Syncope and collapse: Secondary | ICD-10-CM | POA: Diagnosis not present

## 2019-02-13 DIAGNOSIS — M6281 Muscle weakness (generalized): Secondary | ICD-10-CM | POA: Diagnosis not present

## 2019-02-13 DIAGNOSIS — R1310 Dysphagia, unspecified: Secondary | ICD-10-CM | POA: Diagnosis not present

## 2019-02-13 DIAGNOSIS — E559 Vitamin D deficiency, unspecified: Secondary | ICD-10-CM | POA: Diagnosis not present

## 2019-02-13 DIAGNOSIS — G20 Parkinson's disease: Secondary | ICD-10-CM | POA: Diagnosis not present

## 2019-02-21 DIAGNOSIS — M6281 Muscle weakness (generalized): Secondary | ICD-10-CM | POA: Diagnosis not present

## 2019-02-21 DIAGNOSIS — R1310 Dysphagia, unspecified: Secondary | ICD-10-CM | POA: Diagnosis not present

## 2019-02-21 DIAGNOSIS — E559 Vitamin D deficiency, unspecified: Secondary | ICD-10-CM | POA: Diagnosis not present

## 2019-02-21 DIAGNOSIS — G20 Parkinson's disease: Secondary | ICD-10-CM | POA: Diagnosis not present

## 2019-02-27 DIAGNOSIS — U071 COVID-19: Secondary | ICD-10-CM | POA: Diagnosis not present

## 2019-03-06 DIAGNOSIS — U071 COVID-19: Secondary | ICD-10-CM | POA: Diagnosis not present

## 2019-03-11 DIAGNOSIS — U071 COVID-19: Secondary | ICD-10-CM | POA: Diagnosis not present

## 2019-03-19 DIAGNOSIS — U071 COVID-19: Secondary | ICD-10-CM | POA: Diagnosis not present

## 2019-03-20 DIAGNOSIS — I10 Essential (primary) hypertension: Secondary | ICD-10-CM | POA: Diagnosis not present

## 2019-03-20 DIAGNOSIS — M6281 Muscle weakness (generalized): Secondary | ICD-10-CM | POA: Diagnosis not present

## 2019-03-20 DIAGNOSIS — E559 Vitamin D deficiency, unspecified: Secondary | ICD-10-CM | POA: Diagnosis not present

## 2019-03-20 DIAGNOSIS — G20 Parkinson's disease: Secondary | ICD-10-CM | POA: Diagnosis not present

## 2019-03-21 DIAGNOSIS — M6281 Muscle weakness (generalized): Secondary | ICD-10-CM | POA: Diagnosis not present

## 2019-03-21 DIAGNOSIS — J302 Other seasonal allergic rhinitis: Secondary | ICD-10-CM | POA: Diagnosis not present

## 2019-03-21 DIAGNOSIS — G20 Parkinson's disease: Secondary | ICD-10-CM | POA: Diagnosis not present

## 2019-03-21 DIAGNOSIS — E559 Vitamin D deficiency, unspecified: Secondary | ICD-10-CM | POA: Diagnosis not present

## 2020-08-14 IMAGING — CT CT HEAD W/O CM
5 of 6 series · 14 of 47 positions shown, 16 images · non-contrast
Comparison: 11/22/2012

CLINICAL DATA: Multiple falls, bruising to right forehead

EXAM:
CT HEAD WITHOUT CONTRAST
TECHNIQUE: Contiguous axial images were obtained from the base of the skull
through the vertex without intravenous contrast.

[Series 2: head trauma wo · axial · 0.47mm/px · z∈[-107,-52]mm · 2 of 33 slices shown]
[im 11/33  brain]
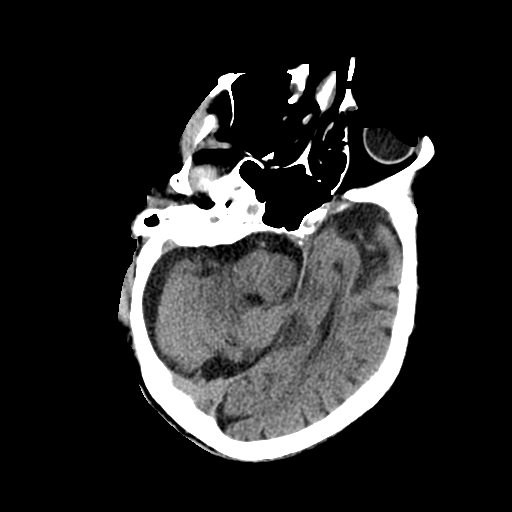
[im 22/33  brain]
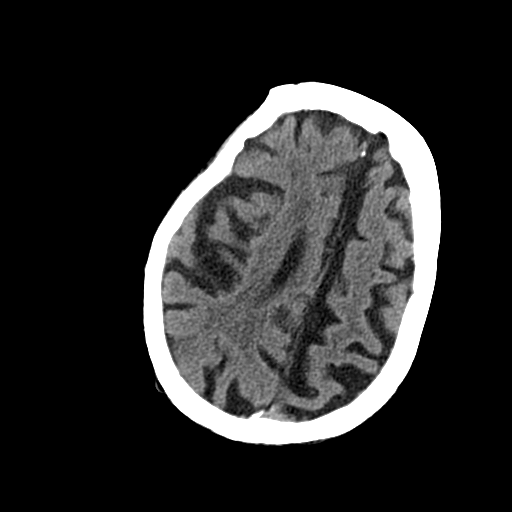

[Series 4: coronal soft tissue · coronal · 0.36mm/px · 3 of 70 slices shown]
[im 24/70  brain]
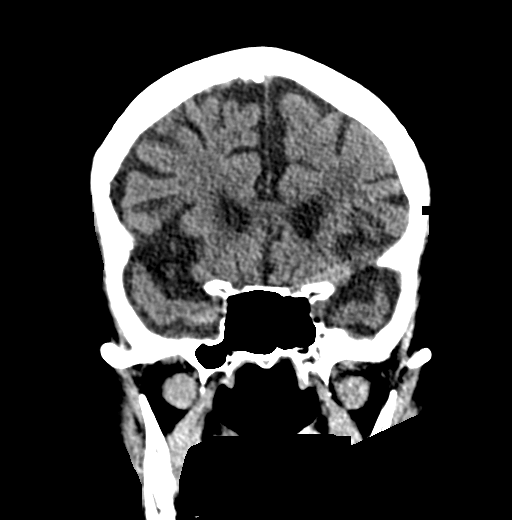
[im 31/70  brain]
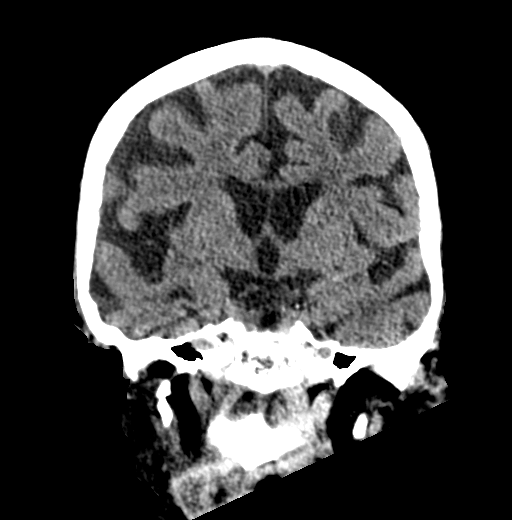
[im 39/70  brain]
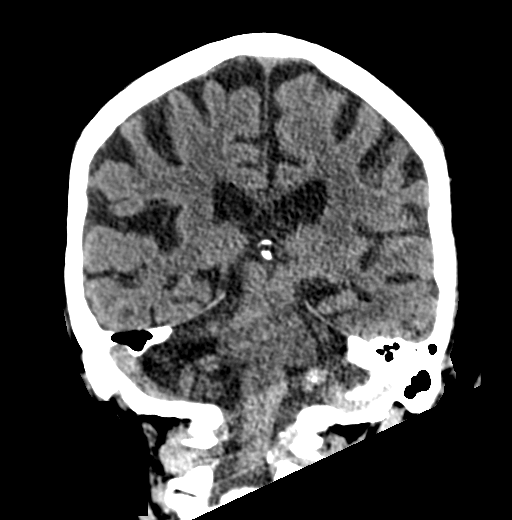

[Series 5: sagittal soft tissue · sagittal · 0.35mm/px · 3 of 54 slices shown (1 of 2)]
[im 24/54  brain]
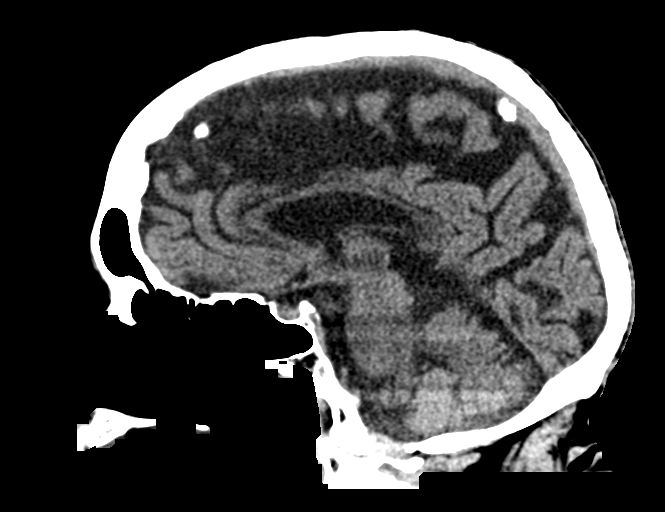
[im 27/54  brain]
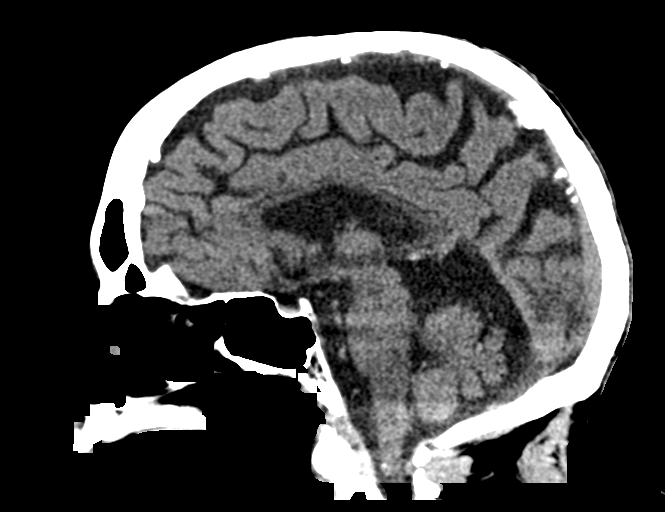
[im 30/54  brain]
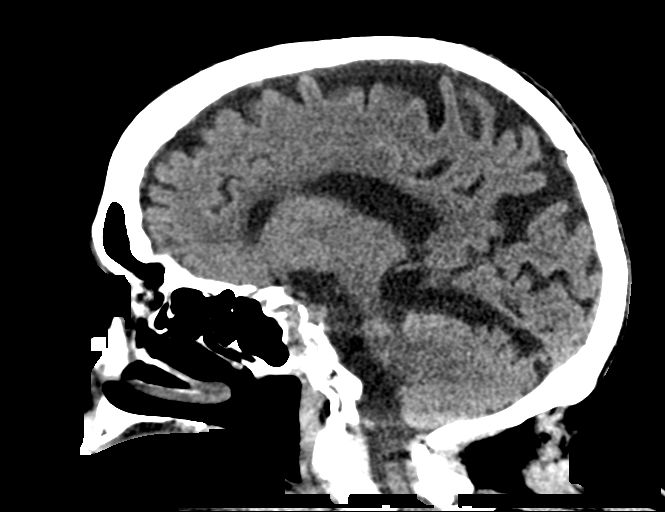

[Series 6: sagittal soft tissue · axial · 0.32mm/px · z∈[-77,+35]mm · 5 of 63 slices shown, 7 images (2 of 2)]
[im 11/63  brain]
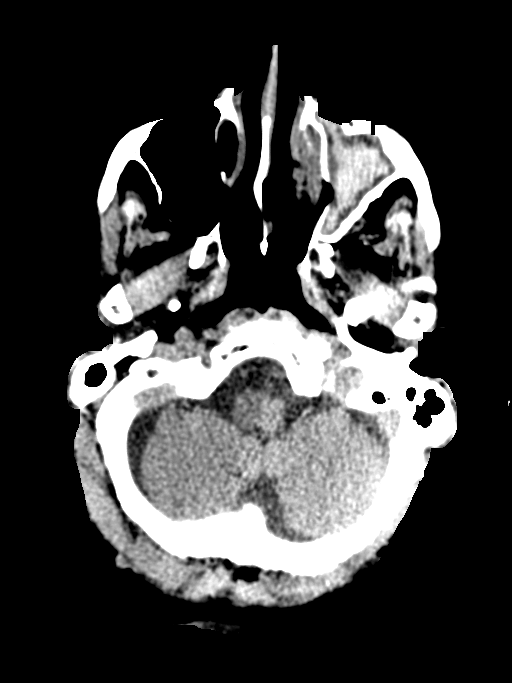
[im 11/63  bone]
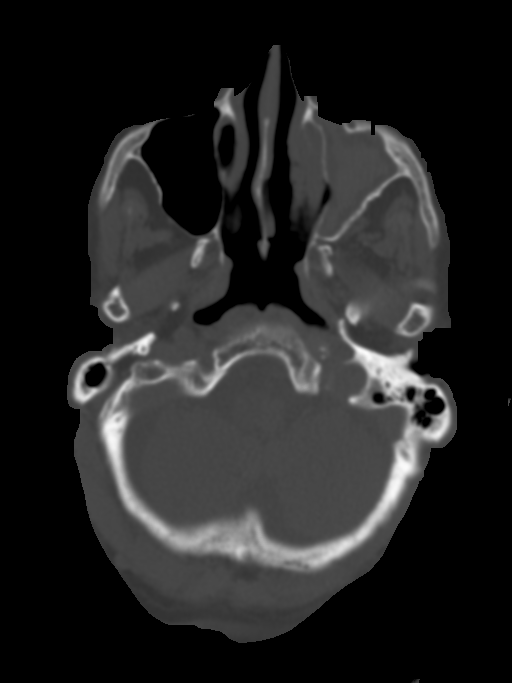
[im 21/63  brain]
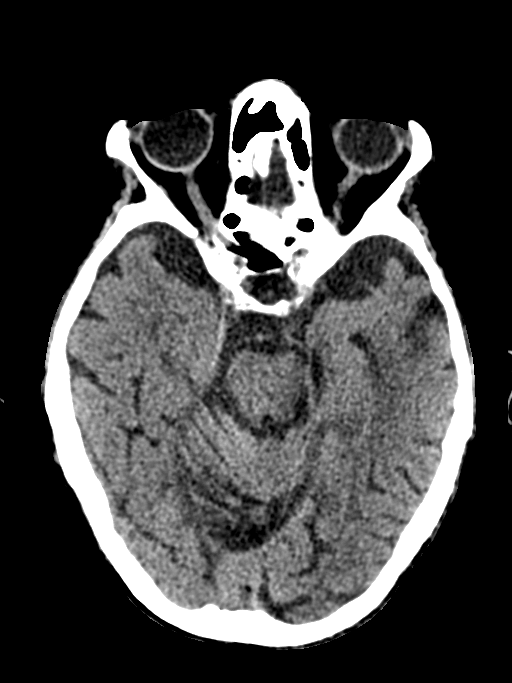
[im 32/63  brain]
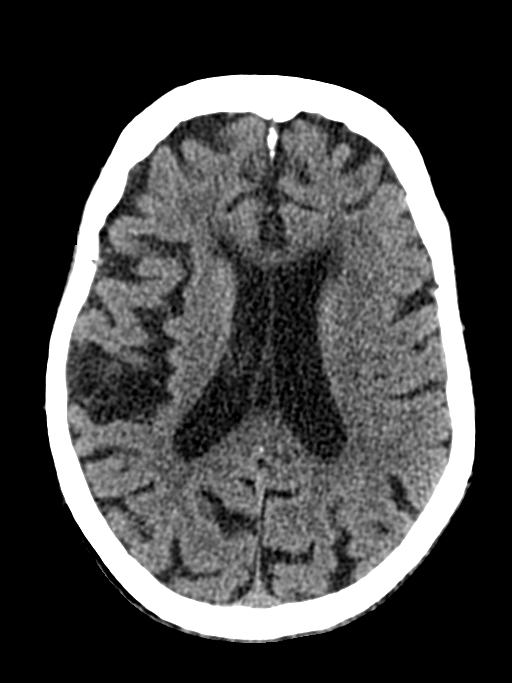
[im 42/63  brain]
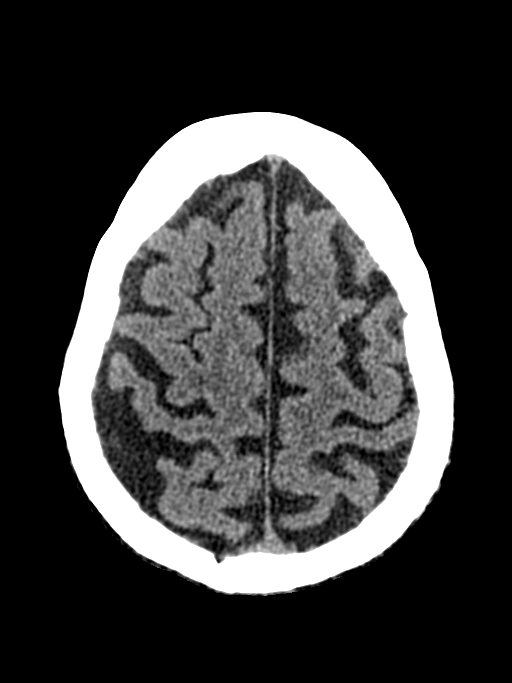
[im 52/63  brain]
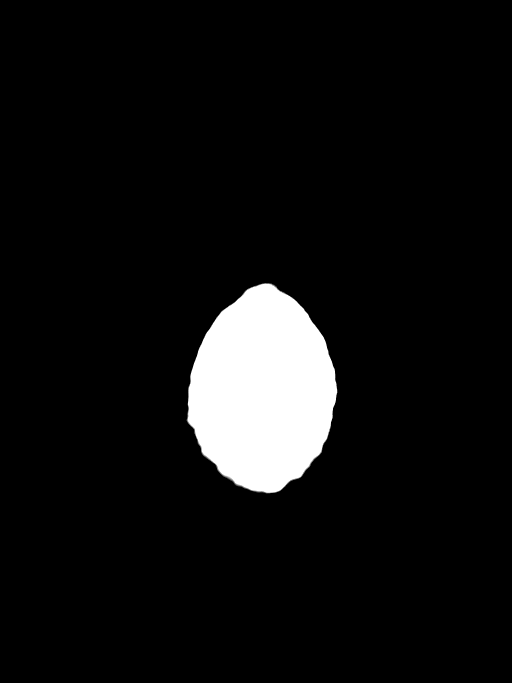
[im 52/63  bone]
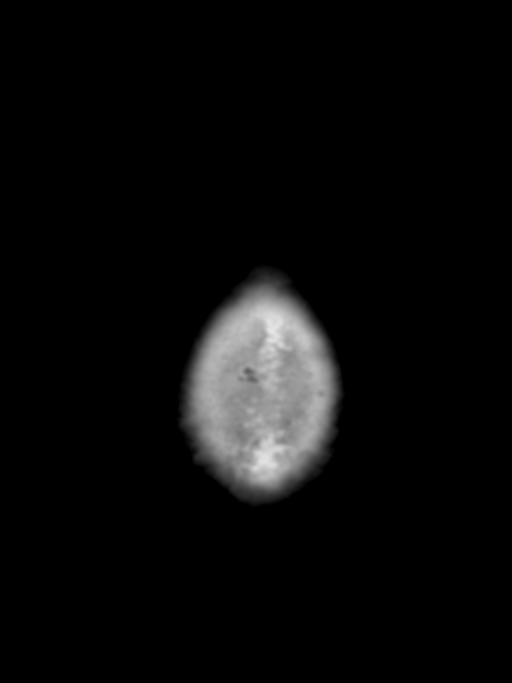

[Series 8: ax head bone · axial · 0.32mm/px · 1 of 90 slices shown]
[im 10/90  bone]
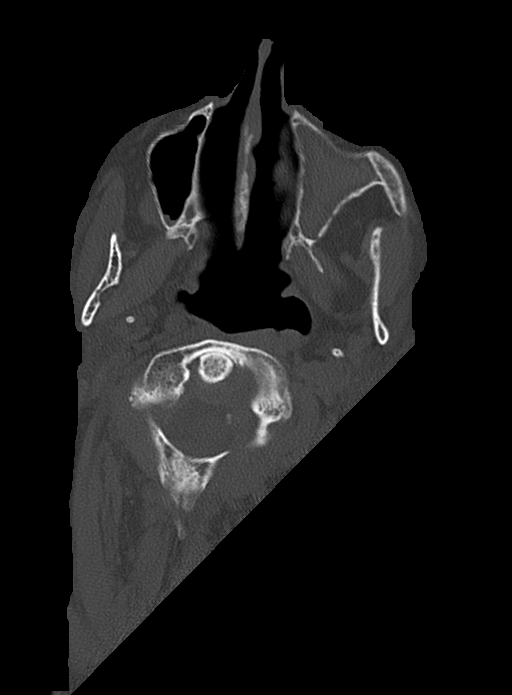

[14 of 47 positions shown; findings below may reference images not displayed]

FINDINGS: Brain: No evidence of acute infarction, hemorrhage, hydrocephalus,
extra-axial collection or mass lesion/mass effect. Periventricular
white matter hypodensity and global volume loss.

Vascular: No hyperdense vessel or unexpected calcification.

Skull: Normal. Negative for fracture or focal lesion.

Sinuses/Orbits: No acute finding. Densely inspissated secretions in
the left maxillary sinus as on prior examination.

Other: None.
IMPRESSION: No acute intracranial pathology. Small-vessel white matter disease
and global volume loss.

## 2020-10-16 DEATH — deceased
